# Patient Record
Sex: Female | Born: 1980 | Race: Black or African American | Hispanic: No | Marital: Single | Smoking: Former smoker
Health system: Southern US, Community
[De-identification: ages and names within clinical notes are randomized; demographics above are authoritative.]

## PROBLEM LIST (undated history)

## (undated) DIAGNOSIS — T8859XA Other complications of anesthesia, initial encounter: Secondary | ICD-10-CM

## (undated) DIAGNOSIS — I10 Essential (primary) hypertension: Secondary | ICD-10-CM

## (undated) DIAGNOSIS — T4145XA Adverse effect of unspecified anesthetic, initial encounter: Secondary | ICD-10-CM

## (undated) HISTORY — DX: Other complications of anesthesia, initial encounter: T88.59XA

## (undated) HISTORY — PX: PILONIDAL CYST EXCISION: SHX744

## (undated) HISTORY — PX: CHOLECYSTECTOMY: SHX55

## (undated) HISTORY — DX: Adverse effect of unspecified anesthetic, initial encounter: T41.45XA

---

## 2001-01-17 ENCOUNTER — Encounter: Payer: Self-pay | Admitting: Internal Medicine

## 2001-01-17 ENCOUNTER — Emergency Department (HOSPITAL_COMMUNITY): Admission: EM | Admit: 2001-01-17 | Discharge: 2001-01-17 | Payer: Self-pay | Admitting: Internal Medicine

## 2001-01-19 ENCOUNTER — Encounter: Payer: Self-pay | Admitting: Internal Medicine

## 2001-01-19 ENCOUNTER — Ambulatory Visit (HOSPITAL_COMMUNITY): Admission: RE | Admit: 2001-01-19 | Discharge: 2001-01-19 | Payer: Self-pay | Admitting: Internal Medicine

## 2004-04-20 ENCOUNTER — Inpatient Hospital Stay (HOSPITAL_COMMUNITY): Admission: RE | Admit: 2004-04-20 | Discharge: 2004-04-26 | Payer: Self-pay | Admitting: General Surgery

## 2006-01-24 IMAGING — US US OB COMP LESS 14 WK
1 series · 14 of 17 positions shown · non-contrast
Comparison: none

CLINICAL DATA: Determine fetal size.
 OB ULTRASOUND LESS THAN 14 WEEKS:
 Examination limited to transabdominal imaging as patient has a pilonidal cyst and is unable to position for adequate transvaginal imaging.  
 Single live intrauterine gestation identified.  Crown rump length measures 3.3 cm corresponding to gestational age of 10 weeks 2 days.  Morphologic survey not performed due to early gestational age.  Amniotic fluid volume subjectively normal.  No subchorionic hemorrhage.  No free fluid.

[Series 1: unknown · 0.32mm/px · 14 of 17 slices shown]
[im 1/17]
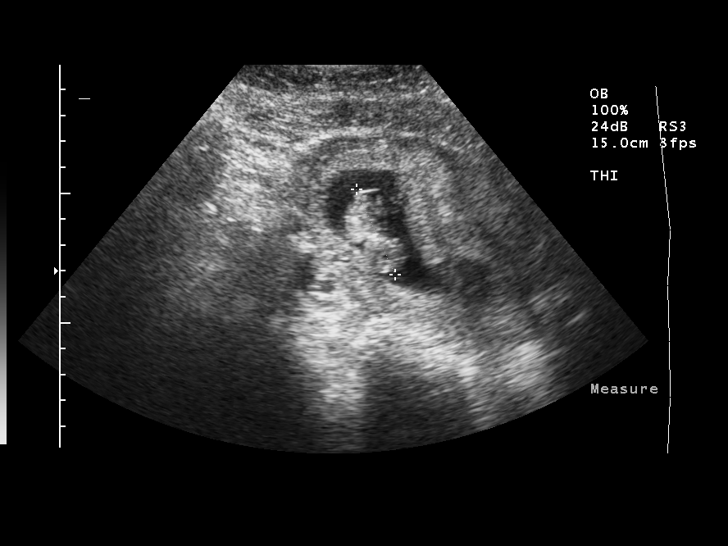
[im 2/17]
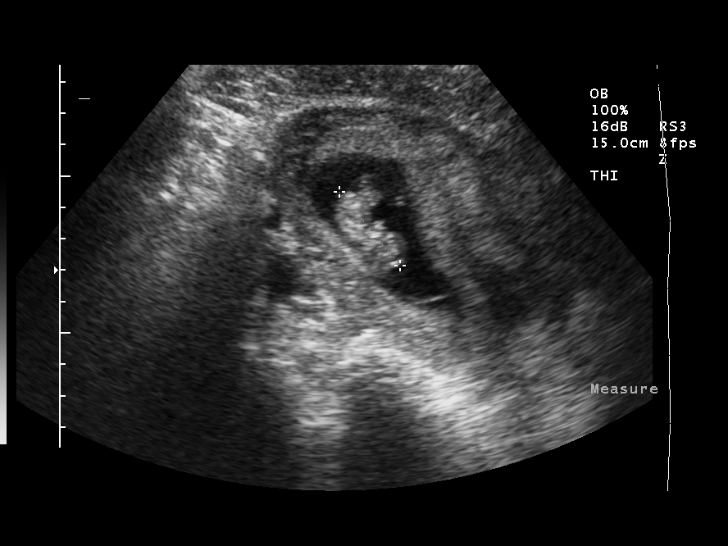
[im 4/17]
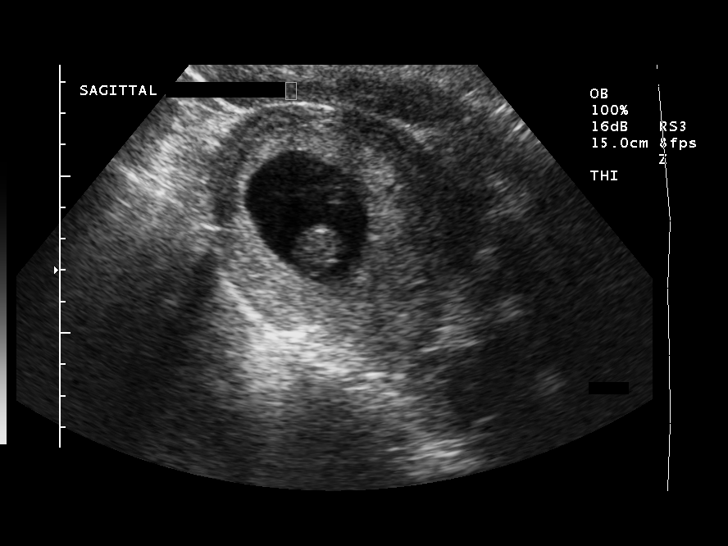
[im 5/17]
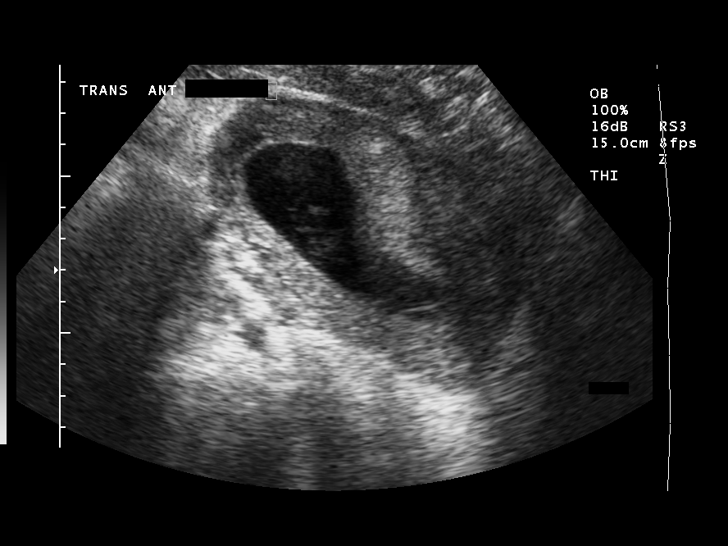
[im 6/17]
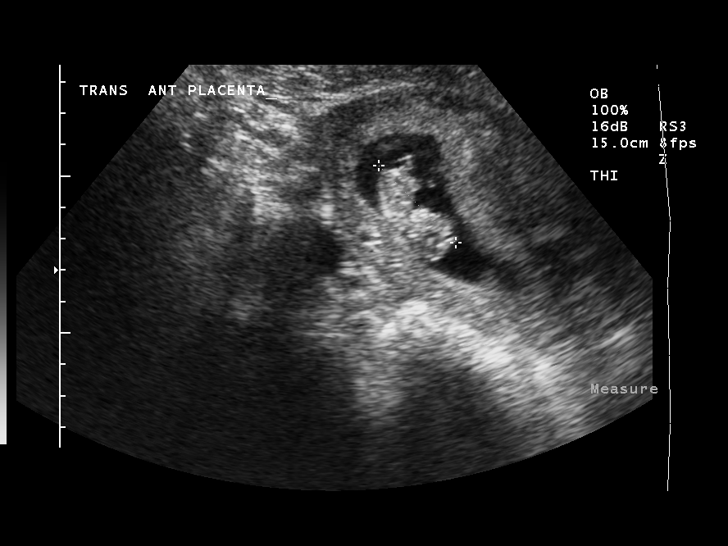
[im 7/17]
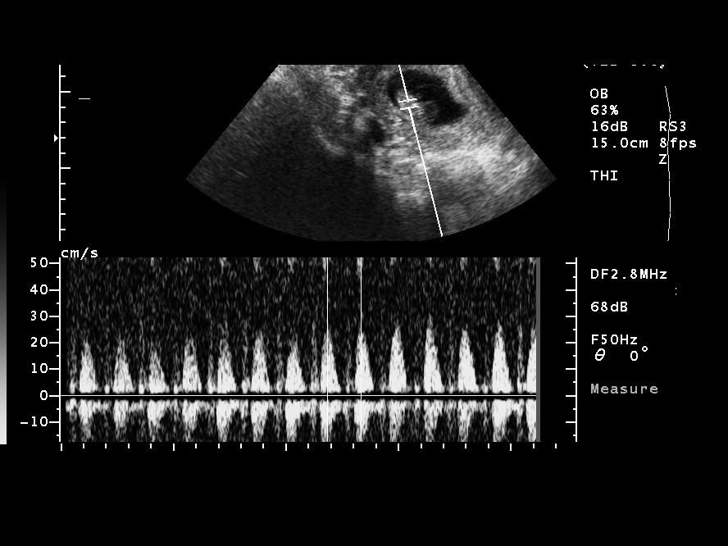
[im 8/17]
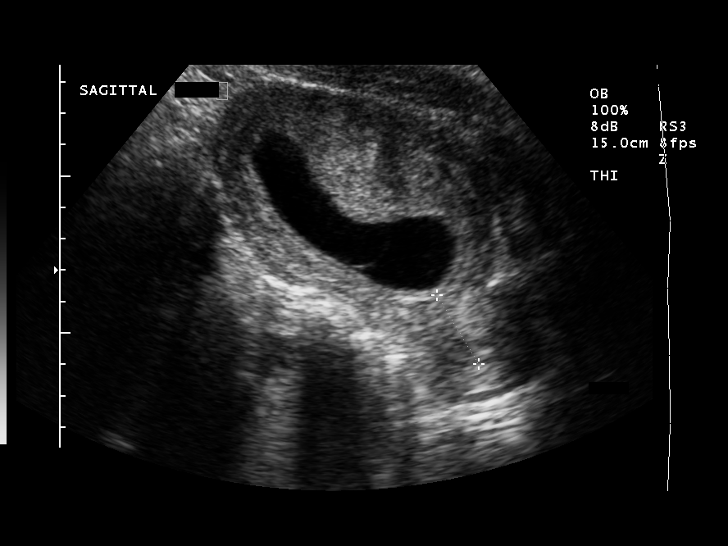
[im 10/17]
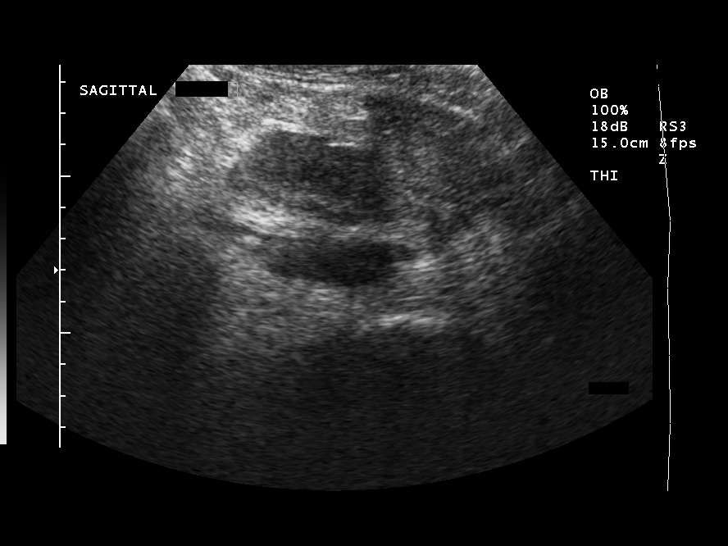
[im 11/17]
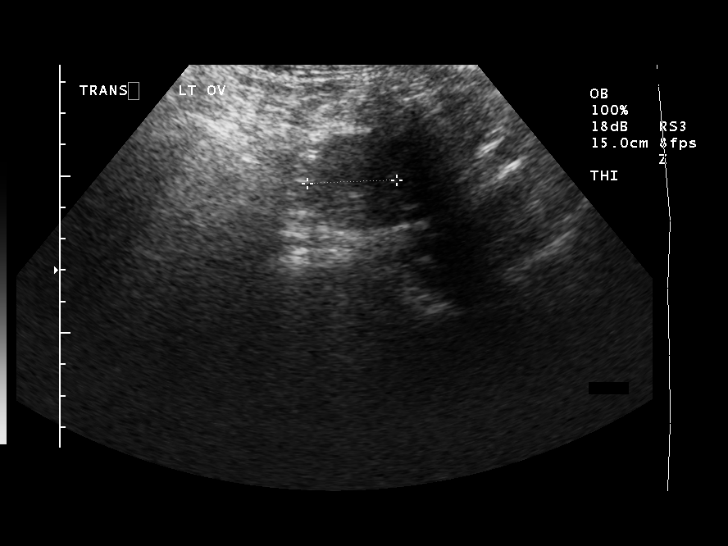
[im 12/17]
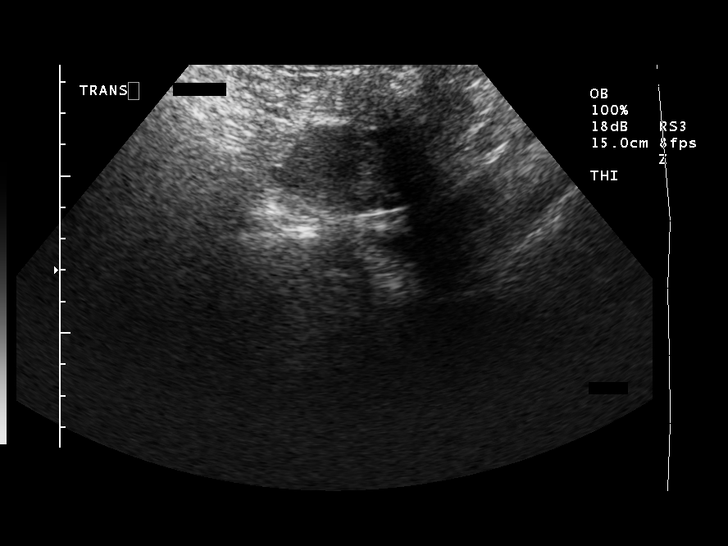
[im 13/17]
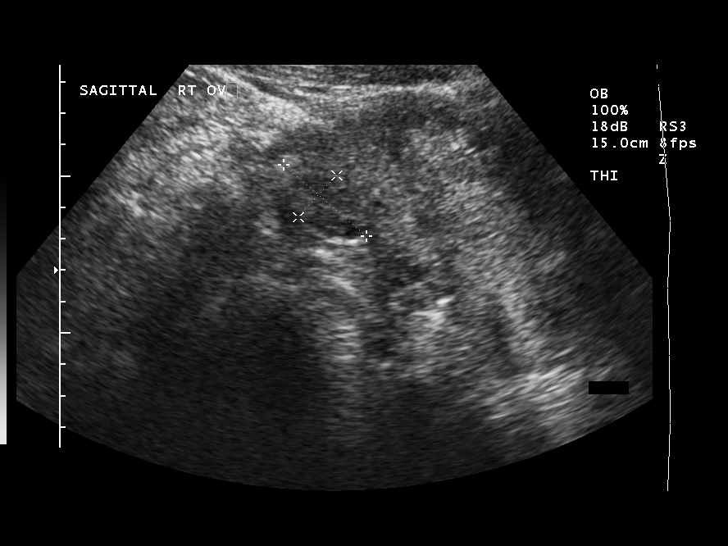
[im 14/17]
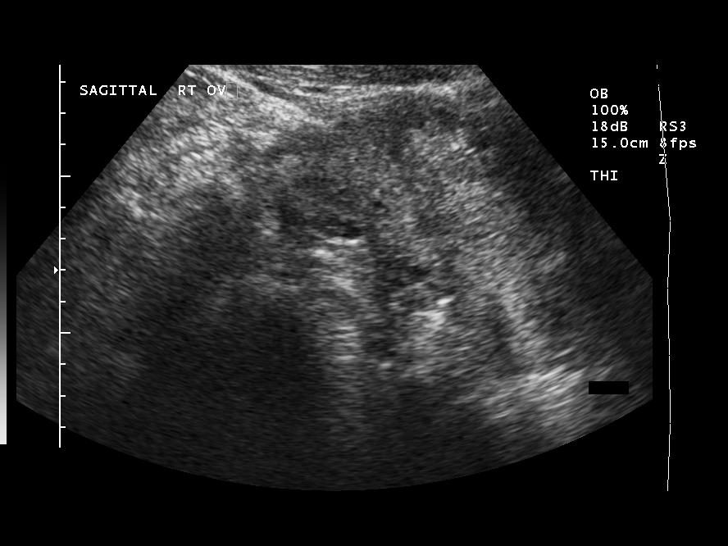
[im 16/17]
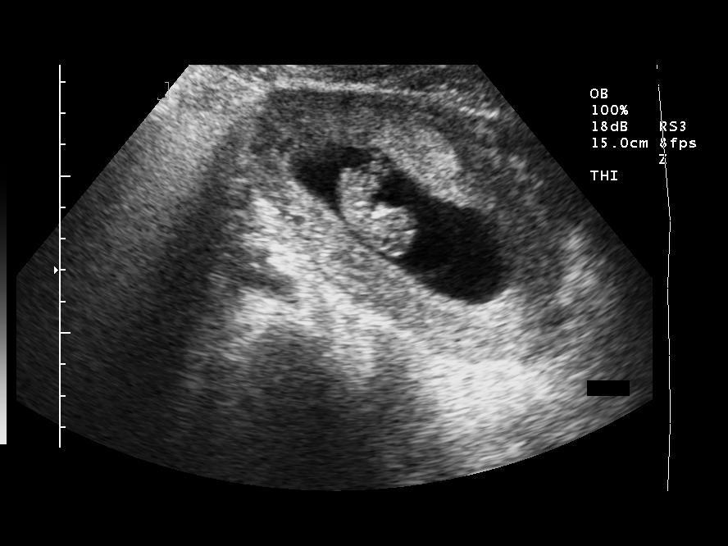
[im 17/17]
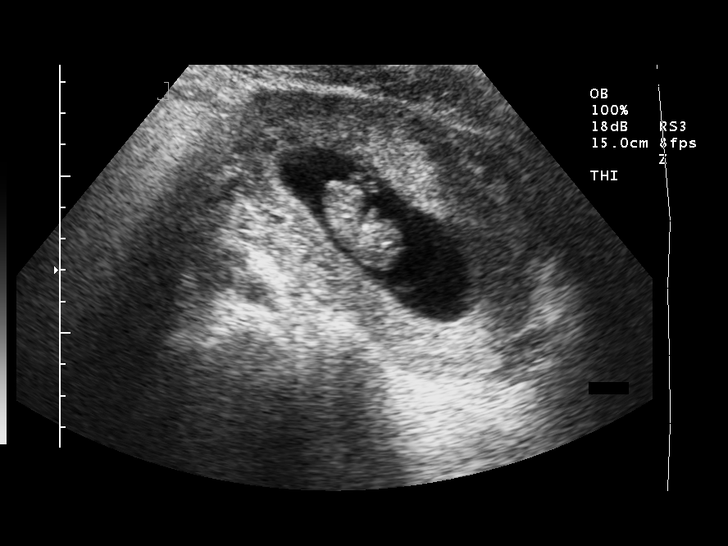

[14 of 17 positions shown; findings below may reference images not displayed]

IMPRESSION: Single live intrauterine gestation measured at 10 weeks 2 days estimated gestational age.  EDC correlates to 11/15/04.  Morphologic survey not performed due to early gestational age.

## 2006-01-26 IMAGING — CR DG CHEST 2V
2 series · 2 of 2 positions shown · non-contrast
Comparison: 04/22/04.

CLINICAL DATA: Pilonidal abscess.  Congestive heart failure.  
 PA AND LATERAL CHEST:

[view not recorded (1 of 2)]
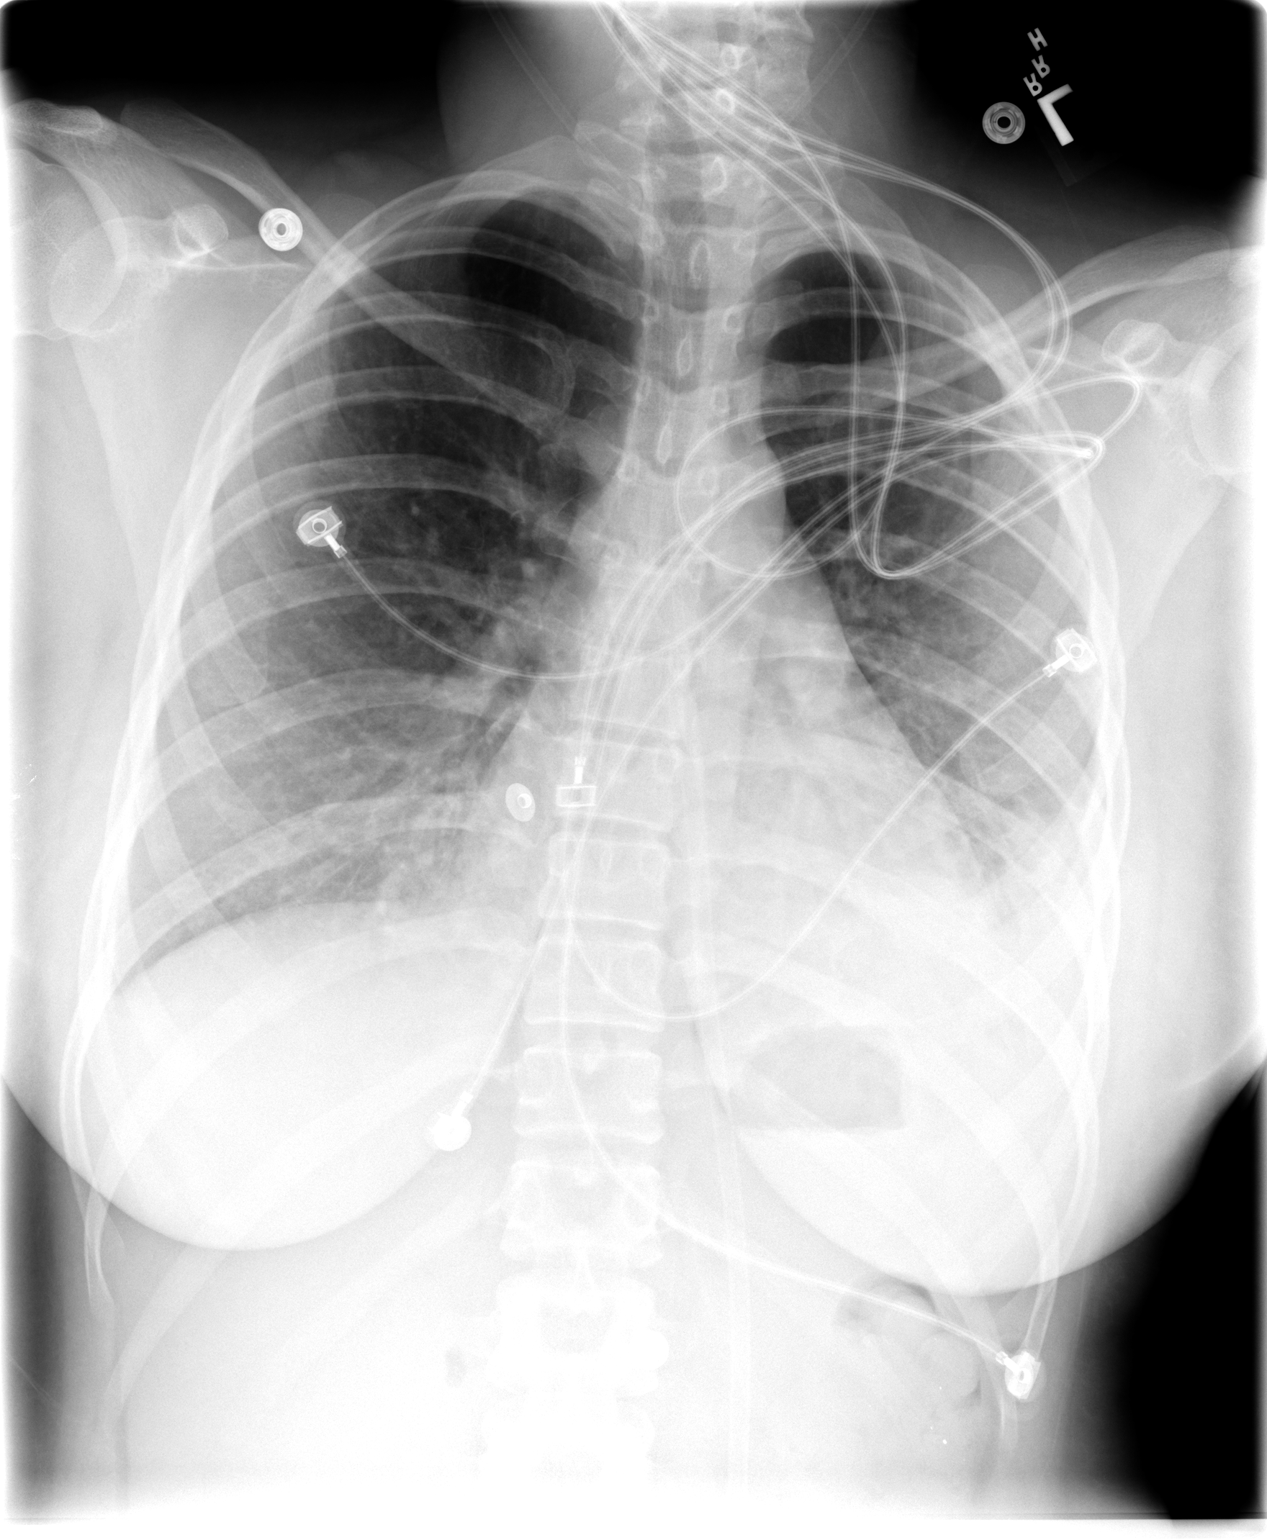

[view not recorded (2 of 2)]
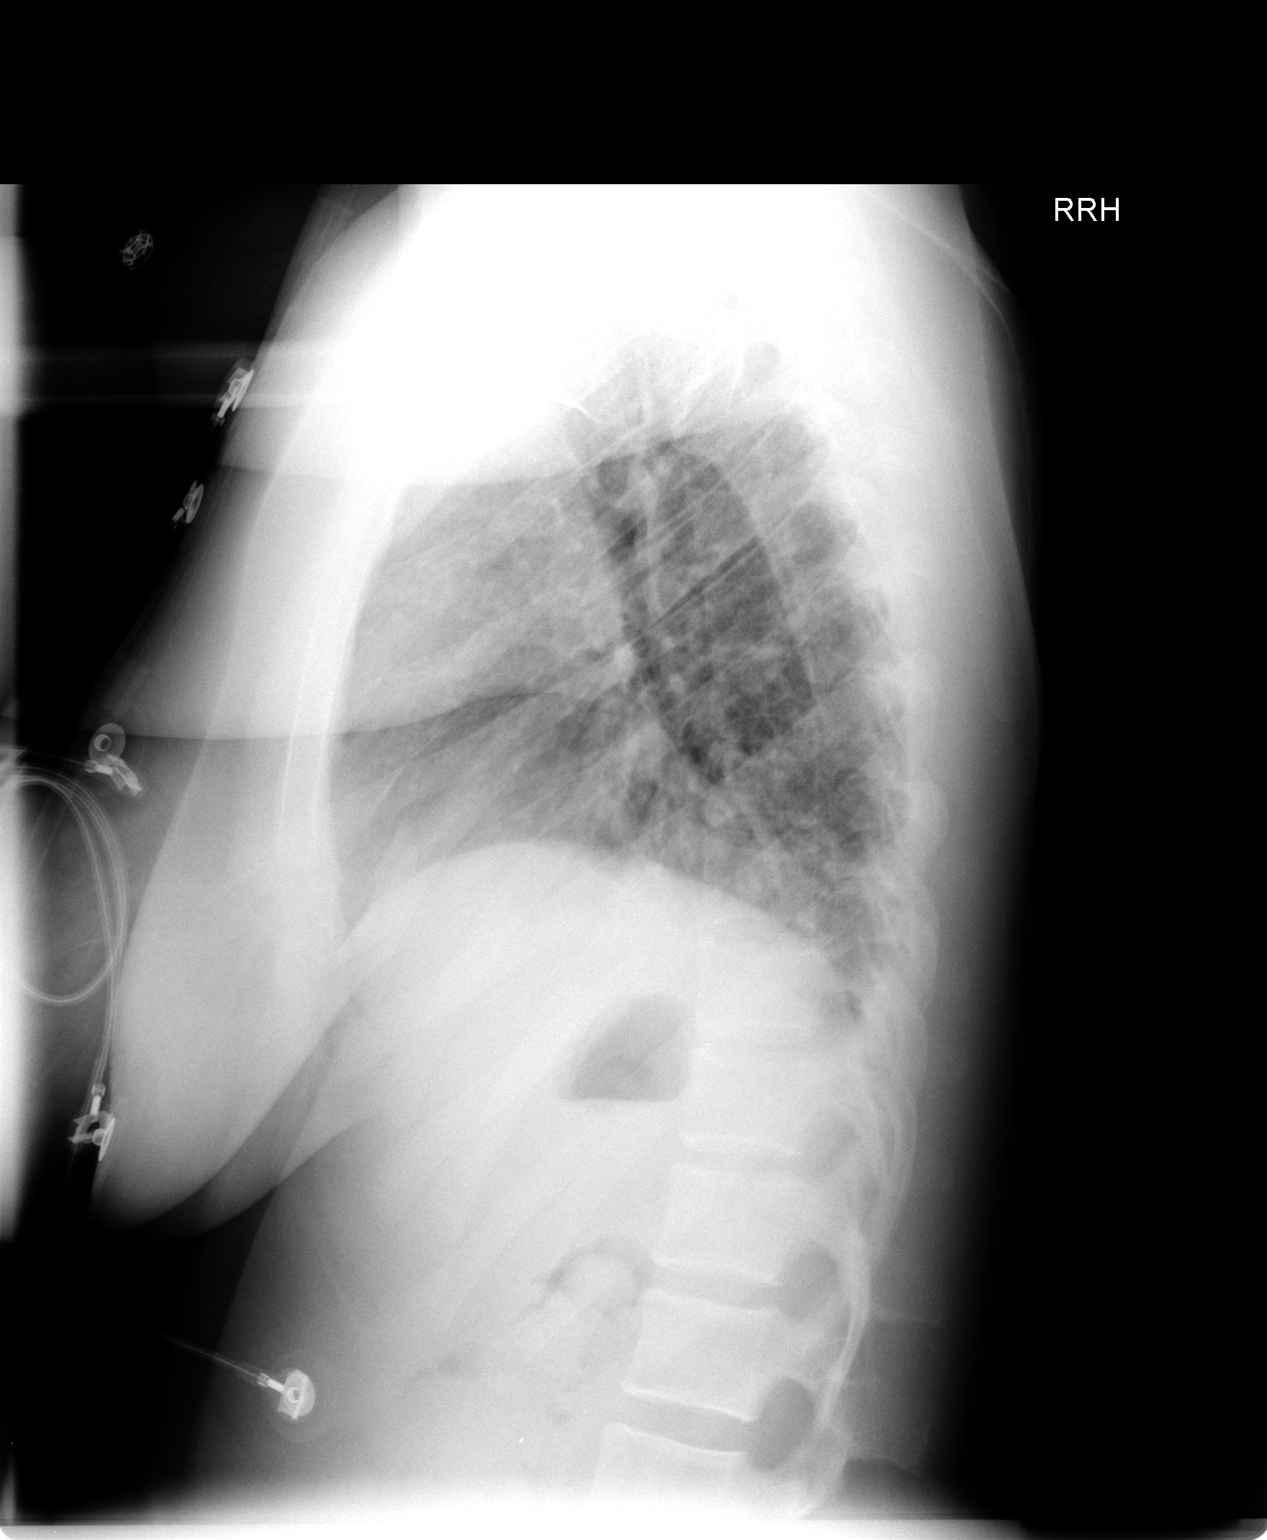

[2 of 2 positions shown; findings below may reference images not displayed]

The patient was double shielded for this examination.  There is persistent asymmetric airspace disease at the left lung base, suspicious for pneumonia.  There is probably a small amount of associated pleural fluid.  The pulmonary vasculature appears better defined with improving edema.  The cardiomediastinal contours are stable.
IMPRESSION: Improving pulmonary edema.  There is persistent asymmetric airspace disease of the left lung base suspicious for pneumonia.

## 2006-03-05 ENCOUNTER — Inpatient Hospital Stay (HOSPITAL_COMMUNITY): Admission: AD | Admit: 2006-03-05 | Discharge: 2006-03-06 | Payer: Self-pay | Admitting: Obstetrics and Gynecology

## 2006-03-26 ENCOUNTER — Inpatient Hospital Stay (HOSPITAL_COMMUNITY): Admission: AD | Admit: 2006-03-26 | Discharge: 2006-03-28 | Payer: Self-pay | Admitting: Obstetrics and Gynecology

## 2006-12-25 ENCOUNTER — Inpatient Hospital Stay (HOSPITAL_COMMUNITY): Admission: AD | Admit: 2006-12-25 | Discharge: 2006-12-26 | Payer: Self-pay | Admitting: Emergency Medicine

## 2006-12-25 ENCOUNTER — Ambulatory Visit: Payer: Self-pay | Admitting: Gastroenterology

## 2006-12-26 ENCOUNTER — Ambulatory Visit: Payer: Self-pay | Admitting: Gastroenterology

## 2006-12-29 ENCOUNTER — Ambulatory Visit (HOSPITAL_COMMUNITY): Admission: RE | Admit: 2006-12-29 | Discharge: 2006-12-29 | Payer: Self-pay | Admitting: Gastroenterology

## 2007-01-24 ENCOUNTER — Encounter (INDEPENDENT_AMBULATORY_CARE_PROVIDER_SITE_OTHER): Payer: Self-pay | Admitting: General Surgery

## 2007-01-24 ENCOUNTER — Ambulatory Visit (HOSPITAL_COMMUNITY): Admission: RE | Admit: 2007-01-24 | Discharge: 2007-01-24 | Payer: Self-pay | Admitting: General Surgery

## 2007-02-27 ENCOUNTER — Ambulatory Visit: Payer: Self-pay | Admitting: Gastroenterology

## 2007-07-18 ENCOUNTER — Ambulatory Visit: Payer: Self-pay | Admitting: Gastroenterology

## 2007-09-09 ENCOUNTER — Emergency Department (HOSPITAL_COMMUNITY): Admission: EM | Admit: 2007-09-09 | Discharge: 2007-09-09 | Payer: Self-pay | Admitting: Emergency Medicine

## 2008-01-08 DIAGNOSIS — R61 Generalized hyperhidrosis: Secondary | ICD-10-CM | POA: Insufficient documentation

## 2008-01-08 DIAGNOSIS — R42 Dizziness and giddiness: Secondary | ICD-10-CM

## 2008-01-08 DIAGNOSIS — K819 Cholecystitis, unspecified: Secondary | ICD-10-CM | POA: Insufficient documentation

## 2008-01-08 DIAGNOSIS — J811 Chronic pulmonary edema: Secondary | ICD-10-CM | POA: Insufficient documentation

## 2008-01-08 DIAGNOSIS — R1013 Epigastric pain: Secondary | ICD-10-CM

## 2008-01-08 DIAGNOSIS — K859 Acute pancreatitis without necrosis or infection, unspecified: Secondary | ICD-10-CM

## 2010-05-02 ENCOUNTER — Emergency Department (HOSPITAL_COMMUNITY)
Admission: EM | Admit: 2010-05-02 | Discharge: 2010-05-02 | Payer: Self-pay | Source: Home / Self Care | Admitting: Emergency Medicine

## 2010-08-10 LAB — URINE MICROSCOPIC-ADD ON

## 2010-08-10 LAB — URINALYSIS, ROUTINE W REFLEX MICROSCOPIC
Bilirubin Urine: NEGATIVE
Glucose, UA: NEGATIVE mg/dL
Ketones, ur: NEGATIVE mg/dL
Nitrite: POSITIVE — AB
Protein, ur: NEGATIVE mg/dL
Specific Gravity, Urine: 1.013 (ref 1.005–1.030)
Urobilinogen, UA: 1 mg/dL (ref 0.0–1.0)
pH: 5.5 (ref 5.0–8.0)

## 2010-08-10 LAB — URINE CULTURE
Colony Count: >=100000
Culture  Setup Time: 201112042346

## 2010-08-10 LAB — POCT PREGNANCY, URINE: Preg Test, Ur: NEGATIVE

## 2010-10-12 NOTE — Op Note (Signed)
NAME:  [name] [name], [name] [name]                 ACCOUNT NO.:  [id number]   MEDICAL RECORD NO.:  [id number]          PATIENT TYPE:  AMB   LOCATION:  DAY                           FACILITY:  [facility]   PHYSICIAN:  Dalia Heading, M.D.  DATE OF BIRTH:  08/20/1980   DATE OF PROCEDURE:  01/24/2007  DATE OF DISCHARGE:                               OPERATIVE REPORT   PREOPERATIVE DIAGNOSIS:  Cholecystitis, cholelithiasis, pancreatitis.   POSTOPERATIVE DIAGNOSIS:  Cholecystitis, cholelithiasis, pancreatitis.   PROCEDURE:  Laparoscopic cholecystectomy with intraoperative  cholangiogram.   SURGEON:  Dalia Heading, M.D.   ASSISTANT:  Dr. Franky Macho   ASSISTANT:   ANESTHESIA:  General endotracheal.   INDICATIONS:  The patient is a 30 year old black female who has a  history of gallstone pancreatitis and now presents for laparoscopic  cholecystectomy with cholangiogram.  The risks and benefits of the  procedure including bleeding, infection, hepatobiliary injury, and the  possibility of an open procedure were fully explained to the patient who  gave informed consent.   PROCEDURE NOTE:  The patient was placed in the supine position.  After  induction of general endotracheal anesthesia, the abdomen was prepped  and draped in the usual sterile technique with Betadine.  Surgical site  confirmation was performed.   An infraumbilical incision was made down to the fascia.  A Veress needle  was introduced into the abdominal cavity and confirmation of placement  was done using the saline drop test.  The abdomen was then insufflated  to 16 mmHg pressure.  An 11 mm trocar was introduced into the abdominal  cavity under direct visualization without difficulty.  The patient was  placed in reversed Trendelenburg position and an additional 12 mm trocar  was placed in the epigastric region, a 5 mm trocar was placed in the  right upper quadrant and right flank regions.  The liver was inspected  and noted to be  within normal limits.  The gallbladder was retracted  superior and laterally.  The dissection began around the infundibulum of  the gallbladder.  The cystic duct was first identified.  Its juncture to  the infundibulum was fully identified.  A single Endoclip was placed  proximally on the cystic duct.  An incision was made into the cystic  duct and a cholangiocatheter was inserted.  Under digital fluoroscopy,  cholangiograms were performed.  No filling defect was noted.  The dye  flowed freely into the duodenum.  The common bile duct was noted to be  generous, but again, no choledocholithiasis was seen.  The system was  then flushed with normal saline and the cholangiocatheter removed.   Multiple Endoclips were placed distally on the cystic duct and the  cystic duct was divided.  This was, likewise, done on the cystic artery.  The gallbladder was then freed away from the gallbladder fossa using  Bovie electrocautery.  The gallbladder was delivered through the  epigastric trocar site using an EndoCatch bag.  The gallbladder fossa  was inspected.  No abnormal bleeding or bile leakage was noted.  Surgicel was placed in the  gallbladder fossa.  All fluid and air were  then evacuated from the abdominal cavity prior to removal of the  trocars.   All wounds were irrigated with normal saline.  All wounds were injected  with 0.5% Sensorcaine.  The infraumbilical fascia was reapproximated  using an 0 Vicryl interrupted suture.  All skin incisions were closed  using staples.  Betadine ointment and dry sterile dressings were  applied.  All tape and needle counts were correct at the end of the  procedure.  The patient was extubated in the operating room and went  back to the recovery room awake in stable condition.  Complications were  none.  Specimen gallbladder.  Blood loss minimal.      Dalia Heading, M.D.  Electronically Signed     MAJ/MEDQ  D:  01/24/2007  T:  01/24/2007  Job:   [id number]   cc:   Kassie Mends, M.D.  [address]  [place] , [place] [address]   Tesfaye D. Felecia Shelling, MD  Fax: [phone]

## 2010-10-12 NOTE — Consult Note (Signed)
NAME:  COBBSYulieth, [name]                 ACCOUNT NO.:  [id number]   MEDICAL RECORD NO.:  [id number]          PATIENT TYPE:  INP   LOCATION:  A332                          FACILITY:  [facility]   PHYSICIAN:  Kassie Mends, M.D.      DATE OF BIRTH:  11/16/1980   DATE OF CONSULTATION:  12/25/2006  DATE OF DISCHARGE:                                 CONSULTATION   PRIMARY CARE PHYSICIAN:  Tesfaye D. Felecia Shelling, MD   REASON FOR CONSULTATION:  Epigastric pain and acute pancreatitis.   HISTORY OF PRESENT ILLNESS:  [name] [name] is a 30 year old female who  reported 2-year history of intermittent epigastric abdominal pain. She  describes acute onset of severe epigastric pain usually postprandially.  She was awakened about 3 a.m. this morning with severe pain. She rates  the pain 8/10 on pain scale, describes the pain as squeezing pressure  that radiates to both shoulders and through to her back. She denies any  nausea or vomiting with the pain.  She did have diaphoresis and some  dizziness.  She has tried Tagamet, Prilosec, and Prevacid recently.  She  felt initially began to help, but she continues to have episodes at  least 2-3 times a week.  Pain usually last an hour at worst with lying  supine.  She denies any history of GERD, denies any history of  dysphagia, odynophagia, anorexia, early satiety.  Her bowel movements  have been normal every day.  She denies rectal bleeding or melena.  Her  weight is still increasing.  She denies fever or chills, denies history  of alcohol use or hyperlipidemia.   She had elevated amylase of 396, lipase 704.  CT of abdomen and pelvis  with contrast done today appears negative.  She is suspected to have  pancreatitis.   PAST MEDICAL AND SURGICAL HISTORY:  Status post surgery, she developed  pulmonary edema shortly after surgery.  She has a history of heart  murmur.   ALLERGIES:  No known drug allergies.   FAMILY HISTORY:  Noncontributory.   SOCIAL HISTORY:  She is  single.  She lives with her family.  She has one  healthy son.  She denies tobacco or drug use.  She rarely consumes  alcohol.  Her last drink was over the weekend.  She tells me she does  not drink on a regular basis.   REVIEW OF SYSTEMS:  GYN:  Last menstrual period was July 20.  She tells  me she has regular cycles and does not have problems.   PHYSICAL EXAMINATION:  VITAL SIGNS: Weight 99.6 kg.  Height is 67  inches.  Temperature 97.9, pulse 67, respirations 20, blood pressure  126/68.  O2 saturation 99% on room air.  GENERAL:  [name] [name] is a female who is alert and cooperative, no acute  distress.  HEENT:  Normocephalic and atraumatic.  Sclerae clear, not icteric.  Posterior oropharynx pink and moist without any lesions.  NECK:  Supple without mass or thyromegaly.  HEART:  Regular rate and rhythm.  Normal S1 and S2.  She has a 1/6  murmur.  LUNGS: Clear to auscultation bilaterally.  ABDOMEN:  Protuberant with positive bowel sounds x4.  No bruits  auscultated, nondistended. She has mild tenderness in the epigastrium on  deep palpation. There is no rebound tenderness or guarding, no  hepatosplenomegaly or mass.  EXTREMITIES:  Without clubbing or edema bilaterally.  SKIN:  Warm and dry without rash or jaundice.   LABORATORY DATA:  WBC 6.4, hemoglobin 12.4, hematocrit 38.2, platelets  258.  Glucose 113, calcium 8.9, sodium 137, potassium 4, chloride 104,  CO2 30, BUN 9, creatinine 0.71, total bilirubin 0.4, alkaline  phosphatase 67, AST 18, ALT 14, total protein 6.9, albumin 3.6, amylase  396, lipase 104.   IMPRESSION:  [name] [name]  is a 30 year old female admitted with  intermittent epigastric pain which radiates to her back and shoulders  and associated with elevated amylase and lipase. She denies any history  of significant alcohol intake.  She has a gallbladder in situ.  Differential includes acute pancreatitis secondary to distal common bile  duct stone, sludge, pancreas  divisum, pancreatic sphincter of Oddi  dysfunction, autoimmune pancreatitis, and hypertriglyceridemia.   PLAN:  1. She is to continue n.p.o. status except for ice chips.  2. Will increase IV fluids to D5 normal saline at 200 mL an hour.  3. Check a urine HCG.  4. Agree with supportive measures including pain control and      antiemetics.  5. She will schedule EUS or MRCP to evaluate pancreas.  6. LFTs and lipase in the morning as well as check for triglycerides.   We would like to thank Dr. Felecia Shelling for allowing [place] to participate in the  care of Ms. Ebbert.      Lorenza Burton, N.P.      Kassie Mends, M.D.  Electronically Signed    KJ/MEDQ  D:  12/25/2006  T:  12/25/2006  Job:  [id number]   cc:   Rachael Fee, MD  [address]  [place], [place] [address]

## 2010-10-12 NOTE — H&P (Signed)
NAME:  Pacini, Vianny                 ACCOUNT NO.:  [id number]   MEDICAL RECORD NO.:  [id number]          PATIENT TYPE:  INP   LOCATION:  A332                          FACILITY:  [facility]   PHYSICIAN:  Tesfaye D. Felecia Shelling, MD   DATE OF BIRTH:  1980-10-16   DATE OF ADMISSION:  12/25/2006  DATE OF DISCHARGE:  LH                              HISTORY & PHYSICAL   CHIEF COMPLAINT:  Abdominal pain.   HISTORY OF PRESENT ILLNESS:  This is a 30 year old female patient with  no significant past medical history, who came to the emergency room with  the above complaint.  The patient has a history of intermittent  abdominal pain for the last several weeks.  She has been taking proton  pump inhibitors for epigastric pain.  The patient developed worsening of  right upper quadrant pain since December 22, 2006.  She continued to have  more epigastric and left upper quadrant pain, which was radiating to her  back.  She try to take pain medications.  However, her symptoms got  worse and she came to emergency room, where she was evaluated.  Her labs  showed elevated amylase and lipase.  However, her CT scan of the abdomen  was negative for any acute lesion.  The patient was admitted as a case  of acute pancreatitis.   REVIEW OF SYSTEMS:  The patient had symptoms of nausea and abdominal  discomfort.  No fever, chills, headache, chest pain, dysuria, urgency or  frequency of urination.   PAST MEDICAL HISTORY:  1. Intermittent abdominal pain which was being treated as possible      gastritis.  2. History of pilonidal abscess.   CURRENT MEDICATIONS:  Prevacid 30 mg p.o. daily.   SOCIAL HISTORY:  The patient is single.  No history of alcohol, tobacco  or substance abuse.   PHYSICAL EXAMINATION:  GENERAL:  The patient is alert, awake and sick-  looking.  VITALS:  Blood pressure 128/68, pulse 67, respiratory rate 20,  temperature 97.9 degrees Fahrenheit.  HEENT:  Pupils are equal and reactive.  NECK:  Supple.  CHEST:  Decreased air entry and a few rhonchi.  CARDIOVASCULAR:  First and second heart sounds heard.  No murmur, no  gallop.  ABDOMEN:  Soft and lax.  Bowel sounds are positive.  No mass, no  organomegaly.  There is moderate tenderness on the epigastric and left  upper quadrant area.  EXTREMITIES:  No leg edema.   LABORATORY DATA ON ADMISSION:  Lipase 740, amylase 396.  Sodium 137,  potassium 4, chloride 104, carbon dioxide 30, glucose 113, BUN 9,  creatinine 0.9 and bilirubin 0.4, alkaline phosphatase 67, AST 18, ALT  14, calcium 8.9.  CBC:  WBC 6.9, hemoglobin 12.3, hematocrit 38.2 and  platelet 258,000.   ASSESSMENT:  Acute pancreatitis, etiology not clear at this time.   PLAN:  We will keep the patient n.p.o.  We will continue IV fluids.  We  will continue p.r.n. pain medications.  We will do GI consult.  I will  continue Protonix.      Tesfaye  Ludwig Clarks, MD  Electronically Signed     TDF/MEDQ  D:  12/25/2006  T:  12/26/2006  Job:  [id number]

## 2010-10-12 NOTE — Assessment & Plan Note (Signed)
NAME:  [name] [name], [name] [name]                  CHART#:  [id number]   DATE:  02/27/2007                       DOB:  08/04/1980   PROBLEM LIST:  1. Gallstone pancreatitis resulting in subsequent laparoscopic      cholecystectomy.  Pathology showed gallstones and cholecystitis.  2. History of pilonidal cyst excision.   SUBJECTIVE:  [name] is a 30 year old female who presents in followup  after a laparoscopic cholecystectomy.  She denies any diarrhea  currently.  She did initially have loose stools after her gallbladder  was removed.  She does have loose stools when she eats a fatty meal.   OBJECTIVE:  VITAL SIGNS:  Weight 226-1/2 pounds.  Height 5 feet, 7  inches.  Temperature 98.8.  Blood pressure 118/88. Pulse 80.  NEUROLOGICAL:  She is in no apparent distress.  LUNGS:  Clear to auscultation bilaterally.  CARDIOVASCULAR:  Exam has a regular rhythm with no murmurs.  Normal S1  and S2.  ABDOMEN:  Bowel sounds are present, soft, nontender, nondistended.  No  rebound or guarding.   ASSESSMENT:  [name] is a 30 year old female who had gallstone  pancreatitis and is status post laparoscopic cholecystectomy and is  doing well.   Thank you for allowing me to see Ms. Poulsen in consultation.  My  recommendations follow.   RECOMMENDATIONS:  She may follow up with me as needed.       Kassie Mends, M.D.  Electronically Signed     SM/MEDQ  D:  02/27/2007  T:  02/28/2007  Job:  [id number]   cc:   Tesfaye D. Felecia Shelling, MD

## 2010-10-12 NOTE — H&P (Signed)
NAME:  [name], [name]                 ACCOUNT NO.:  [id number]   MEDICAL RECORD NO.:  [id number]          PATIENT TYPE:  AMB   LOCATION:  DAY                           FACILITY:  [facility]   PHYSICIAN:  Dalia Heading, M.D.  DATE OF BIRTH:  1980-12-11   DATE OF ADMISSION:  DATE OF DISCHARGE:  LH                              HISTORY & PHYSICAL   CHIEF COMPLAINT:  Cholecystitis, cholelithiasis, history of gallstone  pancreatitis.   HISTORY OF PRESENT ILLNESS:  The patient is a 30 year old black female  who was referred for evaluation and treatment of biliary colic secondary  to cholelithiasis. She has been having right upper quadrant abdominal  pain, nausea and bloating for many weeks. She had a recent admission for  gallstone pancreatitis. She does have fatty food intolerance. No fever,  chills, jaundice had been noted.   PAST MEDICAL HISTORY:  Unremarkable.   PAST SURGICAL HISTORY:  Pilonidal cyst excision.   CURRENT MEDICATIONS:  Prevacid.   ALLERGIES:  NO KNOWN DRUG ALLERGIES.   REVIEW OF SYSTEMS:  Noncontributory.   PHYSICAL EXAMINATION:  GENERAL APPEARANCE: On physical examination the  patient is a well-developed and well-nourished black female in no acute  distress.  HEENT: Examination reveals no scleral icterus.  LUNGS: The lungs are clear to auscultation with equal breath sounds  bilaterally.  HEART: Examination reveals a regular rate and rhythm without S3, S4,  murmurs.  ABDOMEN: The abdomen is soft and nondistended. She is tender in the  right upper quadrant to palpation. No hepatosplenomegaly, masses or  hernias identified.   LABORATORY DATA:  Ultrasound of the gallbladder reveals cholelithiasis  with a normal common bile duct.   IMPRESSION:  Cholecystitis, cholelithiasis.   PLAN:  The patient is scheduled for a laparoscopic cholecystectomy with  cholangiograms on 01/24/07. The risks and benefits of the procedure,  including bleeding, infection, hepatobiliary  injury, the possibility of  an open procedure were fully explained to the patient, gave informed  consent.      Dalia Heading, M.D.  Electronically Signed     MAJ/MEDQ  D:  01/18/2007  T:  01/19/2007  Job:  [id number]   cc:   Kassie Mends, M.D.  [address]  [place] , [place] [address]   Tesfaye D. Felecia Shelling, MD  Fax: [phone]

## 2010-10-12 NOTE — Assessment & Plan Note (Signed)
NAMESUZETTE, [name] [name]                   CHART#:  [id number]   DATE:  07/18/2007                       DOB:  [birth year]   REFERRING PHYSICIAN:  Avon Gully, MD.   PROBLEM LIST:  1. Gallstone pancreatitis.  2. Laparoscopic cholecystectomy with intraoperative cholangiogram in      August, 2008.  3. Cholecystitis and cholelithiasis.  4. Pilonidal cyst excision.   SUBJECTIVE:  Ms. [name] [name] is 30 year old female who presents with loose  stool after eating.  Sometimes the loose stool goes away and she may  have normal stools.  She may have to run to have a bowel movement  immediately or it may take up to 20 minutes.  She tried reducing fried  foods which did not really change her symptoms significantly.  She has  not tried any medicines.  She can have watery diarrhea.  Prior to her  surgery she used to have hard stool and occasionally have loose stool if  she was ill.  Her last menstrual period was July 05, 2007.   MEDICATIONS:  None.   OBJECTIVE:  PHYSICAL EXAM:  Weight 223 pounds (up 4 pounds since August  of 2008), height 5 feet 7 inches, BMI 34.9 (obese).  Temperature 98.6,  blood pressure 122/84, pulse 80.  GENERAL:  She is in no apparent distress, alert and oriented x4.LUNGS:  Clear to auscultation bilaterally.CARDIOVASCULAR EXAM:  Regular rhythm,  no murmur, normal S1 and S2.ABDOMEN:  Bowel sounds are present, soft,  nontender, nondistended, no rebound or guarding.   ASSESSMENT:  Ms. 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   Thank you for allowing me to see Ms. [name] [name] in consultation.  My  recommendations follow.   RECOMMENDATIONS:  1. Add Colestid one tablet in the morning for 1 week.  If her stools      continue to be loose after eating then she is to increase that to      two in the morning.  2. She is to follow a low-fat diet.  She is given a handout on a low-      fat diet.  3. She has a followup appointment to see me in 6  weeks.       Kassie Mends, M.D.  Electronically Signed     SM/MEDQ  D:  07/18/2007  T:  07/19/2007  Job:  [id number]   cc:   Dalia Heading, M.D.  Tesfaye D. Felecia Shelling, MD

## 2010-10-12 NOTE — Discharge Summary (Signed)
NAME:  [name] [name], [name] [name]                 ACCOUNT NO.:  [id number]   MEDICAL RECORD NO.:  [id number]          PATIENT TYPE:  INP   LOCATION:  A332                          FACILITY:  [facility]   PHYSICIAN:  Kassie Mends, M.D.      DATE OF BIRTH:  [birth year]   DATE OF ADMISSION:  12/25/2006  DATE OF DISCHARGE:  07/29/2008LH                               DISCHARGE SUMMARY   PROBLEMS:  1. Acute idiopathic pancreatitis.  2. History of pilonidal abscess, requiring resection.   DISCHARGE MEDICATIONS:  1. Vicodin 5/500 mg, one to two q.6h. p.r.n. pain, #15.  2. Phenergan 25 mg tab, one to two q.4h. p.r.n. nausea or vomiting,      #20.   DIET:  A low-fat diet.   HISTORY OF PRESENT ILLNESS:  [name] [name] is a 30 year old female who  presents with a two-year history of intermittent epigastric abdominal  pain.  The abdominal pain usually occurs after eating.  On the day of  admission she was awakened with severe abdominal pain and came to the  emergency room.   HOSPITAL COURSE:  [name] [name] was admitted with acute idiopathic  pancreatitis.  Her lipase was 704 and her amylase was 396.  Her liver  enzymes were a total bilirubin of 0.4, alkaline phosphatase 67, AST 18,  ALT 14, total protein 6.9, albumin 3.6.  She had a CT scan of the  abdomen and pelvis with contrast which showed no inflammatory process or  abnormal fluid collection involving the pancreas.  She was admitted with  abdominal pain and nausea.  Overnight she required no Dilaudid or  Zofran.  An abdominal ultrasound was performed on hospital day two,  which revealed small gallstones and normal common bile duct and no  evidence of acute cholecystitis.   On the day of discharge she is tolerating a low-fat diet without any  abdominal pain or nausea.   DISCHARGE LABORATORY DATA:  White count 6.4, hemoglobin 12.3, platelets  258.  Total bilirubin 0.4, direct bilirubin 0.1, alkaline phosphatase  57, AST 15, ALT 11, albumin 3, amylase 91, lipase  22.      Kassie Mends, M.D.  Electronically Signed     SM/MEDQ  D:  12/26/2006  T:  12/26/2006  Job:  [id number]   cc:   Tesfaye D. Felecia Shelling, MD  Fax: [phone]   Rachael Fee, MD  [address]  [place], [place] [address]

## 2010-10-15 NOTE — Procedures (Signed)
NAME:  [name] [name], [name] [name]              ACCOUNT NO.:  [id number]   MEDICAL RECORD NO.:  [id number]          PATIENT TYPE:  INP   LOCATION:  A206                          FACILITY:  [facility]   PHYSICIAN:  Richard A. Alanda Amass, M.D.DATE OF BIRTH:  [birth year]   DATE OF PROCEDURE:  04/23/2004  DATE OF DISCHARGE:                                  ECHOCARDIOGRAM   Echocardiogram was technically adequate.   The aorta is normal at 2.3 cm.   The aortic valve has three leaflets which appear normal.  There is no AS or  AI, and there is normal valve opening and pliable thin leaflets.   The left atrium is normal at 3.8 cm.  There are not clots seen.  The patient  was in sinus rhythm during the study.   IVS and LVPW are not outside the limits of normal measuring 1.3 and 1.0 cm,  respectively.  There was normal motion throughout.   Mitral valve is normal with thin, pliable leaflets.  There was trace mitral  regurgitation and no mitral valve prolapse.   Left ventricular internal dimensions were _________.  LV IDD equals 4.9 cm.  LV ISD equals 2.6 cm.  There was normal systolic thickening and wall motion  throughout with no segmental wall motion abnormalities and normal EF  estimated greater than 55%.  Normal diastolic parameters.   There was no pericardiac effusion.   The aortic root is normal.   Right ventricle was normal.   There is trace to mild tricuspid regurgitation present.   The 2-D echocardiogram was essentially within normal limits.  There were  normal chamber dimensions and no evidence of systolic or diastolic  dysfunction with estimated EF greater than 55% and no significant valvular  abnormality.  There was mild pericardial posterior wall systolic separation  which is not outside the limits of normal.      RAW/MEDQ  D:  04/23/2004  T:  04/23/2004  Job:  [id number]   cc:   Dirk Dress. Katrinka Blazing, M.D.  [address]  [place]  [place] [address]  Fax: [phone]   Dani Gobble, MD  Fax:  [phone]

## 2010-10-15 NOTE — Discharge Summary (Signed)
NAME:  [name] [name], [name] [name]                ACCOUNT NO.:  [id number]   MEDICAL RECORD NO.:  [id number]          PATIENT TYPE:  INP   LOCATION:  A206                          FACILITY:  [facility]   PHYSICIAN:  Dirk Dress. Katrinka Blazing, M.D.   DATE OF BIRTH:  07/01/1980   DATE OF ADMISSION:  04/20/2004  DATE OF DISCHARGE:  11/28/2005LH                                 DISCHARGE SUMMARY   DISCHARGE DIAGNOSES:  1.  Pilonidal abscess  2.  Postoperative pulmonary edema, etiology undefined, resolved.  3.  Intrauterine pregnancy with gestational age by ultrasound of 10 weeks 2      days, as of April 21, 2004.   PROCEDURE:  Wide excision and drainage of major pilonidal abscess on  April 20, 2004.   DISPOSITION:  The patient is discharged home in stable and satisfactory  condition.   DISCHARGE MEDICATIONS:  1.  Keflex 500 mg q.i.d.  2.  Oxycodone 5/325 mg one tab q.6h. p.r.n. pain.   FOLLOWUP:  The patient is scheduled to be seen in the office one week post-  discharge, and will see OB/GYN one week post-discharge.   SUMMARY:  A 30 year old female with a four day history of a painful mass  over her intergluteal crease.  The pain became intense on the day of  evaluation, and she was seen in the office where she was noted to have a  pilonidal abscess.  She was admitted for IV antibiotics and excision of area  under anesthesia.   PHYSICAL EXAMINATION:  General physical examination was unremarkable except  for a tender mass of the intergluteal crease starting at the tip of the  coccyx and extending up to the lower sacral area.  There was no drainage,  but there was extensive erythema.   HOSPITAL COURSE:  The patient was admitted and underwent wide excision of  pilonidal abscess under spinal anesthesia on April 20, 2004.  She did  well in the early postoperative period, but on the early morning of April 22, 2004, she developed headache, shortness of breath, and was noted to have  an O2 saturation of  66%.  She had a blood pressure of 122/66, pulse 128,  respirations 48, temperature 101.1.  Dr. Loleta Chance was on call and he treated  her.  His initial impression was that she may of had a PE, but she had x-ray  evidence of congestive heart failure.  She was started on 100% non-  rebreathing mask and was given IV Lasix.  She improved with this therapy.  Her O2 saturation increased to 92% on 100% non-rebreathing.  The patient's  IV was decreased to 30 cc, and she was transferred to the ICU.  There was a  plan to get a CT scan, but because of the early gestational age of her  pregnancy, this was not done.  Since the patient had clinical evidence of  congestive heart failure, it was felt that she had acute pulmonary edema and  that this needed to be evaluated.  By April 18, 2004, she was see in  consultation by cardiology, Dr. Domingo Sep.  The actual etiology of her  pulmonary edema was never determined.  The patient responded to appropriate  treatment.  She became asymptomatic.  Her tachycardia and hypoxemia  resolved.  Lungs became clear.  By April 25, 2004, she was stable except  for mild dyspnea on exertion.  She had a few basilar rales.  Presacral area  was healing without difficulty.  Room air blood gases revealed a pH of 7.45,  PCO2 of 36, PO2 of 116.  V/Q scan was done and this was interpreted as  showing low probability of PE.  Room air saturation was between 96 and 98%  at that time.  Her wound looked good, and it was therefore decided that she  could be discharged home.  She was discharged home in stable and  satisfactory condition with plans for followup as an outpatient.     Lero   LCS/MEDQ  D:  05/16/2004  T:  05/17/2004  Job:  [id number]

## 2010-10-15 NOTE — H&P (Signed)
NAME:  [name] [name], [name] [name]                 ACCOUNT NO.:  [id number]   MEDICAL RECORD NO.:  [id number]          PATIENT TYPE:  INP   LOCATION:  9166                          FACILITY:  [facility]   PHYSICIAN:  Lenoard Aden, M.D.DATE OF BIRTH:  April 13, 1981   DATE OF ADMISSION:  03/26/2006  DATE OF DISCHARGE:                                HISTORY & PHYSICAL   CHIEF COMPLAINT:  Post dates for induction.   HISTORY OF PRESENT ILLNESS:  She is a 30 year old African-American female,  G1, P0 at 41-3/[redacted] weeks gestation for post dates induction.   PAST MEDICAL HISTORY:  Her past medical history is remarkable for:  1. Uncomplicated abortion.  2. History of pilonidal cyst.  3. 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  4. Respiratory reaction to anesthesia on removal of pilonidal cyst.   ALLERGIES:  She has no known drug allergies.   HABITS:  She is a nonsmoker, nondrinker and she denies domestic or physical  violence.   MEDICATIONS:  Prenatal vitamins.   FAMILY HISTORY:  Hodgkin's disease.   OBSTETRICAL HISTORY:  Prenatal course complicated by post dates status.   PHYSICAL EXAMINATION:  GENERAL APPEARANCE:  Well-developed, well-nourished  African-American female in no acute distress.  HEENT:  Normal.  LUNGS:  Clear.  HEART:  Regular rhythm.  ABDOMEN:  Soft, gravid and nontender.  Estimated fetal weight 8 to 8-1/2  pounds.  PELVIC:  Cervix is 1 cm dilated, 50%, vertex, minus 1.  EXTREMITIES:  Reveal no cords.  NEUROLOGICAL:  Exam is nonfocal   IMPRESSION:  1. Post dates intrauterine pregnancy.  2. Group B Strep positive.   PLAN:  Proceed with attempts at vaginal delivery, Pitocin, epidural as  needed.      Lenoard Aden, M.D.  Electronically Signed     RJT/MEDQ  D:  03/26/2006  T:  03/26/2006  Job:  [id number]

## 2010-10-15 NOTE — Op Note (Signed)
NAME:  [name], [name]                 ACCOUNT NO.:  [id number]   MEDICAL RECORD NO.:  [id number]          PATIENT TYPE:  AMB   LOCATION:  DAY                           FACILITY:  [facility]   PHYSICIAN:  Leroy C. Katrinka Blazing, M.D.   DATE OF BIRTH:  07-22-1980   DATE OF PROCEDURE:  04/20/2004  DATE OF DISCHARGE:                                 OPERATIVE REPORT   PREOPERATIVE DIAGNOSIS:  Pilonidal abscess.   POSTOPERATIVE DIAGNOSIS:  Major pilonidal abscess.   PROCEDURE:  Wide excision of major pilonidal abscess.   SURGEON:  Dirk Dress. Katrinka Blazing, M.D.   DESCRIPTION:  Under spinal anesthesia, the perianal and presacral area were  prepped and draped in a sterile field. Small incision was made in the  midline, and large volume pus was obtained. Aerobic and anaerobic cultures  were taken. The large abscess cavity was then unroofed, removing a segment  of tissue that measured about 4.5 cm. There was extensive inflammatory  necrosis of the tissue down into the presacral fascia. It extended down  towards the perirectal space but did not enter the perirectal space. Copious  irrigation was carried until all inflammatory debris was removed. Swabbing  of the wound was carried out, and the major portion of the inflammatory  tissue was also removed. No necrotic tissue was left. Hemostasis was  achieved. The wound was then packed with 1-inch Iodoform gauze using an  entire bottle. A sterile dressing was applied. The patient was transferred  to a bed and taken to the postoperative anesthetic area in satisfactory  condition.     Lero   LCS/MEDQ  D:  04/20/2004  T:  04/20/2004  Job:  [id number]

## 2010-10-15 NOTE — H&P (Signed)
NAME:  [name] [name], [name] [name]                 ACCOUNT NO.:  [id number]   MEDICAL RECORD NO.:  [id number]           PATIENT TYPE:   LOCATION:                                 FACILITY:   PHYSICIAN:  Jerolyn Shin C. Katrinka Blazing, M.D.        DATE OF BIRTH:   DATE OF ADMISSION:  DATE OF DISCHARGE:  [facility]                                HISTORY & PHYSICAL   HISTORY OF PRESENT ILLNESS:  A 30 year old female who presents with a four-  day history of painful swelling of her gluteal crease.  The pain became  intense on the day of evaluation and she was seen in the office where it was  noted that she had an pilonidal abscess.  She is scheduled for wide excision  of the area under anesthesia.   PAST MEDICAL HISTORY:  She has had no significant medical illness.   MEDICATIONS:  She takes no medication.   ALLERGIES:  She has no allergies.   PAST SURGICAL HISTORY:  She has not had any surgery.   SOCIAL HISTORY:  She does not smoke, drink or use drugs.  She is single and  employed.   PHYSICAL EXAMINATION:  VITAL SIGNS:  Blood pressure 110/70, pulse 72,  respirations 18, weight 190 pounds.  HEENT:  Unremarkable.  NECK:  Supple without JVD or bruits.  CHEST:  Clear to auscultation.  HEART:  Regular rate and rhythm without murmur, gallop or rub.  ABDOMEN:  Soft, mildly obese.  Nontender.  RECTAL:  Unremarkable.  There is a tender mass of the intergluteal crease  starting at about the tip of the coccyx and extending up to the lower sacral  area.  There is no drainage but an extensive area of erythema.  EXTREMITIES:  No cyanosis, clubbing or edema.  NEUROLOGIC:  No focal motor, sensory or cerebellar deficits.   IMPRESSION:  Pilonidal abscess.   PLAN:  Wide excision and drainage under anesthesia.     Lero   LCS/MEDQ  D:  04/19/2004  T:  04/20/2004  Job:  [id number]   cc:   [facility]  Fax: [phone]

## 2011-02-22 LAB — STREP A DNA PROBE: Group A Strep Probe: NEGATIVE

## 2011-03-07 ENCOUNTER — Emergency Department: Payer: Self-pay | Admitting: Emergency Medicine

## 2011-03-11 LAB — BASIC METABOLIC PANEL
BUN: 8
CO2: 30
Calcium: 8.6
Chloride: 106
Creatinine, Ser: 0.76
GFR calc Af Amer: >60
GFR calc non Af Amer: >60
Glucose, Bld: 97
Potassium: 3.8
Sodium: 138

## 2011-03-11 LAB — HEPATIC FUNCTION PANEL
ALT: 29
AST: 23
Albumin: 3.5
Alkaline Phosphatase: 67
Bilirubin, Direct: 0.1
Indirect Bilirubin: 0.3
Total Bilirubin: 0.4
Total Protein: 6.6

## 2011-03-11 LAB — CBC
HCT: 37.9
Hemoglobin: 12.1
MCHC: 32.1
MCV: 77.2 — ABNORMAL LOW
Platelets: 283
RBC: 4.91
RDW: 16.1 — ABNORMAL HIGH
WBC: 3.9 — ABNORMAL LOW

## 2011-03-11 LAB — LIPASE, BLOOD: Lipase: 17

## 2011-03-11 LAB — AMYLASE
Amylase: 232 — ABNORMAL HIGH
Amylase: 78

## 2011-03-11 LAB — HCG, QUANTITATIVE, PREGNANCY: hCG, Beta Chain, Quant, S: <2

## 2011-03-14 LAB — CBC
HCT: 38.2
Hemoglobin: 12.3
MCHC: 32.2
MCV: 77 — ABNORMAL LOW
Platelets: 258
RBC: 4.96
RDW: 14.8 — ABNORMAL HIGH
WBC: 6.4

## 2011-03-14 LAB — URINALYSIS, ROUTINE W REFLEX MICROSCOPIC
Bilirubin Urine: NEGATIVE
Glucose, UA: NEGATIVE
Hgb urine dipstick: NEGATIVE
Ketones, ur: NEGATIVE
Nitrite: NEGATIVE
Protein, ur: NEGATIVE
Specific Gravity, Urine: 1.015
Urobilinogen, UA: 0.2
pH: 6

## 2011-03-14 LAB — HEPATIC FUNCTION PANEL
Albumin: 3 — ABNORMAL LOW
Total Protein: 5.5 — ABNORMAL LOW

## 2011-03-14 LAB — DIFFERENTIAL
Basophils Absolute: 0
Basophils Relative: 1
Eosinophils Absolute: 0
Eosinophils Relative: 0
Lymphocytes Relative: 16
Lymphs Abs: 1
Monocytes Absolute: 0.2
Monocytes Relative: 4
Neutro Abs: 5.1
Neutrophils Relative %: 80 — ABNORMAL HIGH

## 2011-03-14 LAB — PREGNANCY, URINE: Preg Test, Ur: NEGATIVE

## 2011-03-14 LAB — COMPREHENSIVE METABOLIC PANEL
ALT: 14
AST: 18
Albumin: 3.6
Alkaline Phosphatase: 67
BUN: 9
CO2: 30
Calcium: 8.9
Chloride: 104
Creatinine, Ser: 0.71
GFR calc Af Amer: >60
GFR calc non Af Amer: >60
Glucose, Bld: 113 — ABNORMAL HIGH
Potassium: 4
Sodium: 137
Total Bilirubin: 0.4
Total Protein: 6.9

## 2011-03-14 LAB — LIPASE, BLOOD: Lipase: 22

## 2011-03-14 LAB — AMYLASE: Amylase: 396 — ABNORMAL HIGH

## 2011-03-14 LAB — TRIGLYCERIDES: Triglycerides: 65

## 2011-06-29 ENCOUNTER — Emergency Department (HOSPITAL_COMMUNITY)
Admission: EM | Admit: 2011-06-29 | Discharge: 2011-06-29 | Disposition: A | Discharge status: Home / Self Care | Payer: 59 | Source: Home / Self Care | Attending: Family Medicine | Admitting: Family Medicine

## 2011-06-29 ENCOUNTER — Encounter (HOSPITAL_COMMUNITY): Payer: Self-pay | Admitting: *Deleted

## 2011-06-29 DIAGNOSIS — K529 Noninfective gastroenteritis and colitis, unspecified: Secondary | ICD-10-CM

## 2011-06-29 DIAGNOSIS — K5289 Other specified noninfective gastroenteritis and colitis: Secondary | ICD-10-CM

## 2011-06-29 LAB — POCT PREGNANCY, URINE: Preg Test, Ur: NEGATIVE

## 2011-06-29 LAB — POCT URINALYSIS DIP (DEVICE)
Bilirubin Urine: NEGATIVE
Ketones, ur: NEGATIVE mg/dL
Leukocytes, UA: NEGATIVE

## 2011-06-29 MED ORDER — ONDANSETRON HCL 4 MG PO TABS: 4.0000 mg | ORAL_TABLET | Freq: Three times a day (TID) | ORAL | Status: AC | PRN

## 2011-06-29 MED ORDER — DIPHENOXYLATE-ATROPINE 2.5-0.025 MG PO TABS: 1.0000 | ORAL_TABLET | Freq: Four times a day (QID) | ORAL | Status: AC | PRN

## 2011-06-29 NOTE — ED Notes (Signed)
Pt with onset of fever/nausea and vomiting/diarrhea/abdominal cramping Monday night - vomiting resolved - remains with diarrhea/nausea - taking kaopectate diarrhea has slowed - 3 episodes today -

## 2011-06-29 NOTE — ED Provider Notes (Signed)
History     CSN: [id number]  Arrival date & time 06/29/11  1129   First MD Initiated Contact with Patient 06/29/11 1220      Chief Complaint  Patient presents with  . Diarrhea  . Emesis  . Nausea  . Abdominal Cramping    (Consider location/radiation/quality/duration/timing/severity/associated sxs/prior treatment) HPI Comments: [name] [name] presents for evaluation of nausea, vomiting, and watery diarrhea since Monday. She denies any new food exposure or fever, and no sick contacts. She states that the vomiting has resolved. She had diarrhea this morning. She has not eaten anything over the last two days but is tolerating liquids.   Patient is a 31 y.o. female presenting with diarrhea. The history is provided by the patient.  Diarrhea The primary symptoms include abdominal pain, nausea, vomiting and diarrhea. Primary symptoms do not include fever or dysuria. The illness began 2 days ago. The onset was sudden. The problem has not changed since onset. Nausea began 2 days ago. The nausea is associated with eating. The nausea is exacerbated by food.  The vomiting began 2 days ago. Vomiting occurs 2 to 5 times per day.  The diarrhea began 2 days ago. The diarrhea is watery and semi-solid.  The illness does not include chills or odynophagia.    History reviewed. No pertinent past medical history.  Past Surgical History  Procedure Date  . Cholecystectomy   . Pilonidal cyst excision     Family History  Problem Relation Age of Onset  . Hypertension Father   . Cancer Other   . Diabetes Other     History  Substance Use Topics  . Smoking status: Current Everyday Smoker  . Smokeless tobacco: Not on file  . Alcohol Use: No    OB History    Grav Para Term Preterm Abortions TAB SAB Ect Mult Living                  Review of Systems  Constitutional: Negative.  Negative for fever and chills.  HENT: Negative.   Eyes: Negative.   Respiratory: Negative.   Cardiovascular: Negative.     Gastrointestinal: Positive for nausea, vomiting, abdominal pain and diarrhea.  Genitourinary: Negative.  Negative for dysuria.  Musculoskeletal: Negative.   Skin: Negative.   Neurological: Negative.     Allergies  Review of patient's allergies indicates no known allergies.  Home Medications   Current Outpatient Rx  Name Route Sig Dispense Refill  . KAOPECTATE PO Oral Take by mouth.    Marland Kitchen DIPHENOXYLATE-ATROPINE 2.5-0.025 MG PO TABS Oral Take 1 tablet by mouth 4 (four) times daily as needed for diarrhea or loose stools. 30 tablet 0  . ONDANSETRON HCL 4 MG PO TABS Oral Take 1 tablet (4 mg total) by mouth every 8 (eight) hours as needed for nausea. 15 tablet 0    BP 116/80  Pulse 90  Temp(Src) 99.3 F (37.4 C) (Oral)  Resp 20  SpO2 100%  LMP 06/01/2011  Physical Exam  Nursing note and vitals reviewed. Constitutional: She is oriented to person, place, and time. She appears well-developed and well-nourished.  HENT:  Head: Normocephalic and atraumatic.  Eyes: EOM are normal.  Neck: Normal range of motion.  Pulmonary/Chest: Effort normal.  Abdominal: There is tenderness.    Musculoskeletal: Normal range of motion.  Neurological: She is alert and oriented to person, place, and time.  Skin: Skin is warm and dry.  Psychiatric: Her behavior is normal.    ED Course  Procedures (including critical care  time)  Labs Reviewed  POCT URINALYSIS DIP (DEVICE) - Abnormal; Notable for the following:    Hgb urine dipstick TRACE (*)    All other components within normal limits  POCT PREGNANCY, URINE   No results found.   1. Gastroenteritis       MDM  rx for ondansetron and Lomotil given, with bland BRAT diet and instructions        Richardo Priest, MD 06/29/11 1334

## 2012-06-08 ENCOUNTER — Emergency Department (HOSPITAL_COMMUNITY)
Admission: EM | Admit: 2012-06-08 | Discharge: 2012-06-08 | Disposition: A | Discharge status: Home / Self Care | Payer: 59 | Source: Home / Self Care

## 2012-06-08 ENCOUNTER — Encounter (HOSPITAL_COMMUNITY): Payer: Self-pay | Admitting: Emergency Medicine

## 2012-06-08 DIAGNOSIS — J069 Acute upper respiratory infection, unspecified: Secondary | ICD-10-CM

## 2012-06-08 MED ORDER — GUAIFENESIN-CODEINE 100-10 MG/5ML PO SYRP: 5.0000 mL | ORAL_SOLUTION | Freq: Three times a day (TID) | ORAL | Status: DC | PRN

## 2012-06-08 NOTE — ED Notes (Signed)
Waiting discharge papers 

## 2012-06-08 NOTE — ED Notes (Signed)
Pt c/o productive cough with yellow sputum. Scratchy throat. Fever x 2 days. Runny nose and body aches. Pt states that she has been using thera flu with some relief of symptoms.

## 2012-06-08 NOTE — ED Provider Notes (Signed)
[name] [name] is a 32 y.o. female who presents to Urgent Care today for cough, congestion, runny nose, sore throat and body aches for the last 3-4 days. Patient notes that she is a fever until yesterday.  The fever has resolved. She notes that TheraFlu has helped her symptoms.  She denies any trouble breathing nausea vomiting diarrhea chest pain or palpitations.  She notes that she did get a flu shot this year. She notes that contacts at work. She was able to go to work today.    PMH reviewed. Otherwise healthy History  Substance Use Topics  . Smoking status: Current Every Day Smoker  . Smokeless tobacco: Not on file  . Alcohol Use: No   ROS as above Medications reviewed. No current facility-administered medications for this encounter.   Current Outpatient Prescriptions  Medication Sig Dispense Refill  . Attapulgite (KAOPECTATE PO) Take by mouth.      Marland Kitchen guaiFENesin-codeine (ROBITUSSIN AC) 100-10 MG/5ML syrup Take 5 mLs by mouth 3 (three) times daily as needed for cough.  120 mL  0    Exam:  BP 133/95  Pulse 89  Temp 99.1 F (37.3 C) (Oral)  Resp 18  SpO2 99% Gen: Well NAD HEENT: EOMI,  MMM, normal posterior pharynx. Normal tympanic membranes bilaterally Lungs: CTABL Nl WOB Heart: RRR no MRG Abd: NABS, NT, ND Exts: Non edematous BL  LE, warm and well perfused.   No results found for this or any previous visit (from the past 24 hour(s)). No results found.  Assessment and Plan: 32 y.o. female with viral URI with cough.  Plan for symptomatic treatment with over-the-counter TheraFlu or other Tylenol or NSAID.  Additionally we will treat cough with codeine containing cough medicine.  Discussed warning signs or symptoms. Please see discharge instructions. Patient expresses understanding. Followup as needed     Rodolph Bong, MD 06/08/12 [phone]

## 2012-06-08 NOTE — ED Provider Notes (Signed)
Medical screening examination/treatment/procedure(s) were performed by a resident physician and as supervising physician I was immediately available for consultation/collaboration.  Leslee Home, M.D.   Reuben Likes, MD 06/08/12 [phone]

## 2013-06-07 ENCOUNTER — Other Ambulatory Visit: Payer: Self-pay | Admitting: Podiatry

## 2013-06-07 MED ORDER — NAPROXEN 500 MG PO TABS: 500.0000 mg | ORAL_TABLET | Freq: Two times a day (BID) | ORAL | Status: DC

## 2016-05-05 ENCOUNTER — Ambulatory Visit: Payer: Self-pay

## 2016-05-05 DIAGNOSIS — Z3490 Encounter for supervision of normal pregnancy, unspecified, unspecified trimester: Secondary | ICD-10-CM

## 2016-05-05 LAB — POCT PREGNANCY, URINE: Preg Test, Ur: POSITIVE — AB

## 2016-05-05 NOTE — Progress Notes (Signed)
Pt is taking prenatal gummy vit.   Pregnancy wheel and LMP states that the patient is 7163w4d and EDD 08/14/16   See patient in 2-3wks or patient has option to go to health dept  Pt states feeling the baby move per doctor ordered patient a US on Monday.

## 2016-05-09 ENCOUNTER — Ambulatory Visit (HOSPITAL_COMMUNITY)
Admission: RE | Admit: 2016-05-09 | Discharge: 2016-05-09 | Disposition: A | Discharge status: Home / Self Care | Payer: BLUE CROSS/BLUE SHIELD | Source: Ambulatory Visit | Attending: Obstetrics & Gynecology | Admitting: Obstetrics & Gynecology

## 2016-05-09 DIAGNOSIS — Z3490 Encounter for supervision of normal pregnancy, unspecified, unspecified trimester: Secondary | ICD-10-CM

## 2016-05-09 DIAGNOSIS — Z3A26 26 weeks gestation of pregnancy: Secondary | ICD-10-CM | POA: Insufficient documentation

## 2016-05-09 DIAGNOSIS — Z363 Encounter for antenatal screening for malformations: Secondary | ICD-10-CM | POA: Diagnosis not present

## 2016-05-19 ENCOUNTER — Encounter: Payer: Self-pay | Admitting: Certified Nurse Midwife

## 2016-05-19 ENCOUNTER — Ambulatory Visit (INDEPENDENT_AMBULATORY_CARE_PROVIDER_SITE_OTHER): Payer: BLUE CROSS/BLUE SHIELD | Admitting: Certified Nurse Midwife

## 2016-05-19 VITALS — BP 127/72 | HR 88 | Ht 65.0 in | Wt 237.0 lb

## 2016-05-19 DIAGNOSIS — O0930 Supervision of pregnancy with insufficient antenatal care, unspecified trimester: Secondary | ICD-10-CM | POA: Insufficient documentation

## 2016-05-19 DIAGNOSIS — Z23 Encounter for immunization: Secondary | ICD-10-CM

## 2016-05-19 DIAGNOSIS — O99212 Obesity complicating pregnancy, second trimester: Secondary | ICD-10-CM

## 2016-05-19 DIAGNOSIS — Z124 Encounter for screening for malignant neoplasm of cervix: Secondary | ICD-10-CM

## 2016-05-19 DIAGNOSIS — O0932 Supervision of pregnancy with insufficient antenatal care, second trimester: Secondary | ICD-10-CM

## 2016-05-19 DIAGNOSIS — O99210 Obesity complicating pregnancy, unspecified trimester: Secondary | ICD-10-CM | POA: Insufficient documentation

## 2016-05-19 DIAGNOSIS — Z113 Encounter for screening for infections with a predominantly sexual mode of transmission: Secondary | ICD-10-CM

## 2016-05-19 DIAGNOSIS — E669 Obesity, unspecified: Secondary | ICD-10-CM

## 2016-05-19 DIAGNOSIS — Z3492 Encounter for supervision of normal pregnancy, unspecified, second trimester: Secondary | ICD-10-CM

## 2016-05-19 DIAGNOSIS — Z3490 Encounter for supervision of normal pregnancy, unspecified, unspecified trimester: Secondary | ICD-10-CM | POA: Insufficient documentation

## 2016-05-19 DIAGNOSIS — Z3493 Encounter for supervision of normal pregnancy, unspecified, third trimester: Secondary | ICD-10-CM

## 2016-05-19 DIAGNOSIS — Z1151 Encounter for screening for human papillomavirus (HPV): Secondary | ICD-10-CM

## 2016-05-19 NOTE — Progress Notes (Signed)
Subjective:  [name] [name] is a 35 y.o. G3P1011 at [redacted]w[redacted]d being seen today for initial prenatal care visit.  She is currently monitored for the following issues for this low-risk pregnancy and has Supervision of low-risk pregnancy; Late prenatal care affecting pregnancy, antepartum; and Obesity affecting pregnancy, antepartum on her problem list.  Patient reports no complaints.  Contractions: Not present. Vag. Bleeding: None.  Movement: Present. Denies leaking of fluid.   The following portions of the patient's history were reviewed and updated as appropriate: allergies, current medications, past family history, past medical history, past social history, past surgical history and problem list. Problem list updated.  Objective:   Vitals:   05/19/16 0825 05/19/16 0826  BP: 127/72   Pulse: 88   Weight: 237 lb (107.5 kg)   Height:  5\' 5"  (1.651 m)    Fetal Status: Fetal Heart Rate (bpm): 135 Fundal Height: 31 cm Movement: Present  Presentation: Undeterminable  General:  Alert, oriented and cooperative. Patient is in no acute distress.  Skin: Skin is warm and dry. No rash noted.   Cardiovascular: Normal heart rate noted  Respiratory: Normal respiratory effort, no problems with respiration noted  Abdomen: Soft, gravid, appropriate for gestational age. Pain/Pressure: Present     Pelvic: Vag. Bleeding: None     Cervical exam performed Dilation: Closed Effacement (%): 0    Extremities: Normal range of motion.  Edema: Trace  Mental Status: Normal mood and affect. Normal behavior. Normal judgment and thought content.  HEENT: nml  Urinalysis:      Assessment and Plan:  Pregnancy: G3P1011 at [redacted]w[redacted]d  1. Encounter for supervision of low-risk pregnancy in third trimester  - Prenatal Profile - Hemoglobinopathy Evaluation - Culture, OB Urine - Pain Mgmt, Profile 6 Conf w/o mM, U - Cytology - PAP - Flu Vaccine QUAD 36+ mos IM (Fluarix, Quad PF) - Tdap vaccine greater than or equal to 7yo  IM  2. Late prenatal care affecting pregnancy, antepartum -2 hr GTT next visit  3. Obesity affecting pregnancy, antepartum -watch fundal height (S>D), may be d/t habitus  Preterm labor symptoms and general obstetric precautions including but not limited to vaginal bleeding, contractions, leaking of fluid and fetal movement were reviewed in detail with the patient. Please refer to After Visit Summary for other counseling recommendations.  Return in about 2 weeks (around 06/02/2016).   [name] [name], CNM

## 2016-05-20 LAB — HEMOGLOBINOPATHY EVALUATION
HEMATOCRIT: 30.3 % — AB (ref 35.0–45.0)
HEMOGLOBIN: 10 g/dL — AB (ref 11.7–15.5)
HGB A: 96.7 % (ref >96.0)
Hgb A2 Quant: 2.3 % (ref 1.8–3.5)
Hgb F Quant: <1 % (ref <2.0)
MCH: 26.9 pg — AB (ref 27.0–33.0)
MCV: 81.5 fL (ref 80.0–100.0)
RBC: 3.72 MIL/uL — AB (ref 3.80–5.10)
RDW: 14.2 % (ref 11.0–15.0)

## 2016-05-20 LAB — PRENATAL PROFILE (SOLSTAS)
Antibody Screen: NEGATIVE
BASOS ABS: 0 {cells}/uL (ref 0–200)
Basophils Relative: 0 %
EOS ABS: 75 {cells}/uL (ref 15–500)
EOS PCT: 1 %
HCT: 30.3 % — ABNORMAL LOW (ref 35.0–45.0)
HEMOGLOBIN: 10 g/dL — AB (ref 11.7–15.5)
HEP B S AG: NEGATIVE
HIV: NONREACTIVE
LYMPHS ABS: 1350 {cells}/uL (ref 850–3900)
Lymphocytes Relative: 18 %
MCH: 26.9 pg — AB (ref 27.0–33.0)
MCHC: 33 g/dL (ref 32.0–36.0)
MCV: 81.5 fL (ref 80.0–100.0)
MPV: 10.5 fL (ref 7.5–12.5)
Monocytes Absolute: 600 cells/uL (ref 200–950)
Monocytes Relative: 8 %
NEUTROS ABS: 5475 {cells}/uL (ref 1500–7800)
NEUTROS PCT: 73 %
PLATELETS: 293 10*3/uL (ref 140–400)
RBC: 3.72 MIL/uL — ABNORMAL LOW (ref 3.80–5.10)
RDW: 14.2 % (ref 11.0–15.0)
RUBELLA: 2.15 {index} — AB (ref <0.90)
Rh Type: POSITIVE
WBC: 7.5 10*3/uL (ref 3.8–10.8)

## 2016-05-20 LAB — CYTOLOGY - PAP
Chlamydia: NEGATIVE
DIAGNOSIS: NEGATIVE
HPV: NOT DETECTED
Neisseria Gonorrhea: NEGATIVE

## 2016-05-20 LAB — PAIN MGMT, PROFILE 6 CONF W/O MM, U
6 ACETYLMORPHINE: NEGATIVE ng/mL (ref <10)
ALCOHOL METABOLITES: NEGATIVE ng/mL (ref <500)
Amphetamines: NEGATIVE ng/mL (ref <500)
Barbiturates: NEGATIVE ng/mL (ref <300)
Benzodiazepines: NEGATIVE ng/mL (ref <100)
COCAINE METABOLITE: NEGATIVE ng/mL (ref <150)
Creatinine: 143.7 mg/dL (ref >=20.0)
MARIJUANA METABOLITE: NEGATIVE ng/mL (ref <20)
Methadone Metabolite: NEGATIVE ng/mL (ref <100)
OPIATES: NEGATIVE ng/mL (ref <100)
OXIDANT: NEGATIVE ug/mL (ref <200)
Oxycodone: NEGATIVE ng/mL (ref <100)
PLEASE NOTE: 0
Phencyclidine: NEGATIVE ng/mL (ref <25)
pH: 7.15 (ref 4.5–9.0)

## 2016-05-20 LAB — CULTURE, OB URINE: Organism ID, Bacteria: NO GROWTH

## 2016-05-25 ENCOUNTER — Other Ambulatory Visit: Payer: Self-pay | Admitting: General Practice

## 2016-05-25 ENCOUNTER — Encounter: Payer: Self-pay | Admitting: General Practice

## 2016-05-25 DIAGNOSIS — O99013 Anemia complicating pregnancy, third trimester: Secondary | ICD-10-CM

## 2016-05-25 MED ORDER — POLYSACCHARIDE IRON COMPLEX 150 MG PO CAPS: 150.0000 mg | ORAL_CAPSULE | Freq: Every day | ORAL | 3 refills | Status: DC

## 2016-05-30 NOTE — L&D Delivery Note (Signed)
Delivery Note Admitted for IOL for gestational hypertension.  Had complicated course - started induction with cytotec afternoon of 3/5, transitioned to pitocin overnight.  Foley bulb placed 3/6 AM and stopped pitocin/restarted cytotec.  Again transitioned to pitocin overnight.  Foley bulb out this morning (3/7), pitocin continued.  After AROM this afternoon developed acute chest pain; quickly resolved, EKG normal, never recurred.  However, developed severe range blood pressures and Cr increased so she was started on mag this afternoon.  She progressed to complete and was ready to push this evening around 8PM.  At 8:18 PM a viable female was delivered via Vaginal, Spontaneous Delivery (Presentation: LOA).  APGAR: 7, 9; weight 7 lb 12.4 oz (3527 g).   Placenta status: spontaneously delivered.  Cord: 3VC.  Cord gasses not collected.  Anesthesia:  epidural Episiotomy: None Lacerations: 1st degree sulcal,not repaired Est. Blood Loss (mL): 250  Mom to postpartum.  Baby to Couplet care / Skin to Skin.  Continue mag for 24 hours postpartum.  Charlsie MerlesJulia Rhoden 08/03/2016, 8:38 PM  OB FELLOW DELIVERY ATTESTATION  I was gloved and present for the delivery in its entirety, and I agree with the above resident's note.    Ernestina PennaNicholas Schenk, MD 8:45 PM

## 2016-06-02 ENCOUNTER — Ambulatory Visit (INDEPENDENT_AMBULATORY_CARE_PROVIDER_SITE_OTHER): Payer: BLUE CROSS/BLUE SHIELD | Admitting: Student

## 2016-06-02 VITALS — BP 129/74 | HR 92 | Wt 236.3 lb

## 2016-06-02 DIAGNOSIS — A5901 Trichomonal vulvovaginitis: Secondary | ICD-10-CM | POA: Insufficient documentation

## 2016-06-02 DIAGNOSIS — O23593 Infection of other part of genital tract in pregnancy, third trimester: Secondary | ICD-10-CM

## 2016-06-02 DIAGNOSIS — Z3493 Encounter for supervision of normal pregnancy, unspecified, third trimester: Secondary | ICD-10-CM

## 2016-06-02 MED ORDER — METRONIDAZOLE 500 MG PO TABS: 2000.0000 mg | ORAL_TABLET | Freq: Once | ORAL | 0 refills | Status: AC

## 2016-06-02 NOTE — Progress Notes (Signed)
   PRENATAL VISIT NOTE  Subjective:  [name] [name] is a 36 y.o. G3P1011 at 865w4d being seen today for ongoing prenatal care.  She is currently monitored for the following issues for this low-risk pregnancy and has Supervision of low-risk pregnancy; Late prenatal care affecting pregnancy, antepartum; Obesity affecting pregnancy, antepartum; and Trichomonal vaginitis during pregnancy in third trimester on her problem list.  Patient reports no complaints.  Contractions: Not present. Vag. Bleeding: None.  Movement: Present. Denies leaking of fluid.   The following portions of the patient's history were reviewed and updated as appropriate: allergies, current medications, past family history, past medical history, past social history, past surgical history and problem list. Problem list updated.  Objective:   Vitals:   06/02/16 1320  BP: 129/74  Pulse: 92  Weight: 236 lb 4.8 oz (107.2 kg)    Fetal Status: Fetal Heart Rate (bpm): 147 Fundal Height: 31 cm Movement: Present     General:  Alert, oriented and cooperative. Patient is in no acute distress.  Skin: Skin is warm and dry. No rash noted.   Cardiovascular: Normal heart rate noted  Respiratory: Normal respiratory effort, no problems with respiration noted  Abdomen: Soft, gravid, appropriate for gestational age. Pain/Pressure: Absent     Pelvic:  Cervical exam deferred        Extremities: Normal range of motion.  Edema: Trace  Mental Status: Normal mood and affect. Normal behavior. Normal judgment and thought content.   Assessment and Plan:  Pregnancy: G3P1011 at 3265w4d  1. Encounter for supervision of low-risk pregnancy in third trimester  - 2Hr GTT w/ 1 Hr Carpenter 75 g  2. Trichomonal vaginitis during pregnancy in third trimester -Pt notified of results -- rx sent to pharmacy -- no IC x 1 week after both her & partner treated -- [facility] - metroNIDAZOLE (FLAGYL) 500 MG tablet; Take 4 tablets (2,000 mg total)  by mouth once.  Dispense: 4 tablet; Refill: 0  Preterm labor symptoms and general obstetric precautions including but not limited to vaginal bleeding, contractions, leaking of fluid and fetal movement were reviewed in detail with the patient. Please refer to After Visit Summary for other counseling recommendations.  Return in about 2 weeks (around 06/16/2016) for Routine OB.   Judeth HornErin Loxley Schmale, NP

## 2016-06-02 NOTE — Patient Instructions (Signed)
Trichomoniasis Trichomoniasis is an infection caused by an organism called Trichomonas. The infection can affect both women and men. In women, the outer female genitalia and the vagina are affected. In men, the penis is mainly affected, but the prostate and other reproductive organs can also be involved. Trichomoniasis is a sexually transmitted infection (STI) and is most often passed to another person through sexual contact.  RISK FACTORS  Having unprotected sexual intercourse.  Having sexual intercourse with an infected partner. SIGNS AND SYMPTOMS  Symptoms of trichomoniasis in women include:  Abnormal gray-green frothy vaginal discharge.  Itching and irritation of the vagina.  Itching and irritation of the area outside the vagina. Symptoms of trichomoniasis in men include:   Penile discharge with or without pain.  Pain during urination. This results from inflammation of the urethra. DIAGNOSIS  Trichomoniasis may be found during a Pap test or physical exam. Your health care provider may use one of the following methods to help diagnose this infection:  Testing the pH of the vagina with a test tape.  Using a vaginal swab test that checks for the Trichomonas organism. A test is available that provides results within a few minutes.  Examining a urine sample.  Testing vaginal secretions. Your health care provider may test you for other STIs, including HIV. TREATMENT   You may be given medicine to fight the infection. Women should inform their health care provider if they could be or are pregnant. Some medicines used to treat the infection should not be taken during pregnancy.  Your health care provider may recommend over-the-counter medicines or creams to decrease itching or irritation.  Your sexual partner will need to be treated if infected.  Your health care provider may test you for infection again 3 months after treatment. HOME CARE INSTRUCTIONS   Take medicines only as  directed by your health care provider.  Take over-the-counter medicine for itching or irritation as directed by your health care provider.  Do not have sexual intercourse while you have the infection.  Women should not douche or wear tampons while they have the infection.  Discuss your infection with your partner. Your partner may have gotten the infection from you, or you may have gotten it from your partner.  Have your sex partner get examined and treated if necessary.  Practice safe, informed, and protected sex.  See your health care provider for other STI testing. SEEK MEDICAL CARE IF:   You still have symptoms after you finish your medicine.  You develop abdominal pain.  You have pain when you urinate.  You have bleeding after sexual intercourse.  You develop a rash.  Your medicine makes you sick or makes you throw up (vomit). MAKE SURE YOU:  Understand these instructions.  Will watch your condition.  Will get help right away if you are not doing well or get worse. This information is not intended to replace advice given to you by your health care provider. Make sure you discuss any questions you have with your health care provider. Document Released: 11/09/2000 Document Revised: 06/06/2014 Document Reviewed: 02/25/2013 Elsevier Interactive Patient Education  2017 [employer] Third Trimester of Pregnancy The third trimester is from week 29 through week 42, months 7 through 9. This trimester is when your unborn baby (fetus) is growing very fast. At the end of the ninth month, the unborn baby is about 20 inches in length. It weighs about 6-10 pounds. Follow these instructions at home:  Avoid all smoking, herbs, and alcohol.  Avoid drugs not approved by your doctor.  Do not use any tobacco products, including cigarettes, chewing tobacco, and electronic cigarettes. If you need help quitting, ask your doctor. You may get counseling or other support to help you  quit.  Only take medicine as told by your doctor. Some medicines are safe and some are not during pregnancy.  Exercise only as told by your doctor. Stop exercising if you start having cramps.  Eat regular, healthy meals.  Wear a good support bra if your breasts are tender.  Do not use hot tubs, steam rooms, or saunas.  Wear your seat belt when driving.  Avoid raw meat, uncooked cheese, and liter boxes and soil used by cats.  Take your prenatal vitamins.  Take 1500-2000 milligrams of calcium daily starting at the 20th week of pregnancy until you deliver your baby.  Try taking medicine that helps you poop (stool softener) as needed, and if your doctor approves. Eat more fiber by eating fresh fruit, vegetables, and whole grains. Drink enough fluids to keep your pee (urine) clear or pale yellow.  Take warm water baths (sitz baths) to soothe pain or discomfort caused by hemorrhoids. Use hemorrhoid cream if your doctor approves.  If you have puffy, bulging veins (varicose veins), wear support hose. Raise (elevate) your feet for 15 minutes, 3-4 times a day. Limit salt in your diet.  Avoid heavy lifting, wear low heels, and sit up straight.  Rest with your legs raised if you have leg cramps or low back pain.  Visit your dentist if you have not gone during your pregnancy. Use a soft toothbrush to brush your teeth. Be gentle when you floss.  You can have sex (intercourse) unless your doctor tells you not to.  Do not travel far distances unless you must. Only do so with your doctor's approval.  Take prenatal classes.  Practice driving to the hospital.  Pack your hospital bag.  Prepare the baby's room.  Go to your doctor visits. Get help if:  You are not sure if you are in labor or if your water has broken.  You are dizzy.  You have mild cramps or pressure in your lower belly (abdominal).  You have a nagging pain in your belly area.  You continue to feel sick to your  stomach (nauseous), throw up (vomit), or have watery poop (diarrhea).  You have bad smelling fluid coming from your vagina.  You have pain with peeing (urination). Get help right away if:  You have a fever.  You are leaking fluid from your vagina.  You are spotting or bleeding from your vagina.  You have severe belly cramping or pain.  You lose or gain weight rapidly.  You have trouble catching your breath and have chest pain.  You notice sudden or extreme puffiness (swelling) of your face, hands, ankles, feet, or legs.  You have not felt the baby move in over an hour.  You have severe headaches that do not go away with medicine.  You have vision changes. This information is not intended to replace advice given to you by your health care provider. Make sure you discuss any questions you have with your health care provider. Document Released: 08/10/2009 Document Revised: 10/22/2015 Document Reviewed: 07/17/2012 Elsevier Interactive Patient Education  2017 [employer].

## 2016-06-03 LAB — 2HR GTT W 1 HR, CARPENTER, 75 G
Glucose, 1 Hr, Gest: 106 mg/dL (ref <180)
Glucose, 2 Hr, Gest: 106 mg/dL (ref <153)
Glucose, Fasting, Gest: 63 mg/dL — ABNORMAL LOW (ref 65–91)

## 2016-06-05 ENCOUNTER — Encounter: Payer: Self-pay | Admitting: Obstetrics and Gynecology

## 2016-06-16 ENCOUNTER — Encounter: Payer: BLUE CROSS/BLUE SHIELD | Admitting: Obstetrics and Gynecology

## 2016-06-27 ENCOUNTER — Ambulatory Visit (INDEPENDENT_AMBULATORY_CARE_PROVIDER_SITE_OTHER): Payer: BLUE CROSS/BLUE SHIELD | Admitting: Obstetrics and Gynecology

## 2016-06-27 VITALS — BP 125/70 | HR 96 | Wt 241.1 lb

## 2016-06-27 DIAGNOSIS — Z3493 Encounter for supervision of normal pregnancy, unspecified, third trimester: Secondary | ICD-10-CM

## 2016-06-27 DIAGNOSIS — O2240 Hemorrhoids in pregnancy, unspecified trimester: Secondary | ICD-10-CM

## 2016-06-27 DIAGNOSIS — O2243 Hemorrhoids in pregnancy, third trimester: Secondary | ICD-10-CM

## 2016-06-27 NOTE — Progress Notes (Signed)
Discussed weight gain is above recommended and discussed stratagies. C/o hemorrhoids- denies constipation. Discussed measures to use.

## 2016-06-27 NOTE — Progress Notes (Signed)
   PRENATAL VISIT NOTE  Subjective:  [name] [name] is a 36 y.o. G3P1011 at 4812w1d being seen today for ongoing prenatal care.  She is currently monitored for the following issues for this low-risk pregnancy and has Supervision of low-risk pregnancy; Late prenatal care affecting pregnancy, antepartum; Obesity affecting pregnancy, antepartum; Trichomonal vaginitis during pregnancy in third trimester; and Hemorrhoids during pregnancy, antepartum on her problem list.  Patient reports no complaints.  Contractions: Not present. Vag. Bleeding: None.  Movement: Present. Denies leaking of fluid.   The following portions of the patient's history were reviewed and updated as appropriate: allergies, current medications, past family history, past medical history, past social history, past surgical history and problem list. Problem list updated.  Objective:   Vitals:   06/27/16 0849  BP: 125/70  Pulse: 96  Weight: 241 lb 1.6 oz (109.4 kg)    Fetal Status: Fetal Heart Rate (bpm): 131 Fundal Height: 34 cm Movement: Present     General:  Alert, oriented and cooperative. Patient is in no acute distress.  Skin: Skin is warm and dry. No rash noted.   Cardiovascular: Normal heart rate noted  Respiratory: Normal respiratory effort, no problems with respiration noted  Abdomen: Soft, gravid, appropriate for gestational age. Pain/Pressure: Absent     Pelvic:  Cervical exam deferred        Extremities: Normal range of motion.  Edema: Trace  Mental Status: Normal mood and affect. Normal behavior. Normal judgment and thought content.   Assessment and Plan:  Pregnancy: G3P1011 at 3612w1d  1. Encounter for supervision of low-risk pregnancy in third trimester  - Will need TOC next visit. Treated for Trichomonas on 1/4 - Discussed 2 hour GTT- WNL   Preterm labor symptoms and general obstetric precautions including but not limited to vaginal bleeding, contractions, leaking of fluid and fetal movement were reviewed  in detail with the patient. Please refer to After Visit Summary for other counseling recommendations.  Return in about 2 weeks (around 07/11/2016).   Duane LopeJennifer I Rasch, NP

## 2016-07-14 ENCOUNTER — Encounter: Payer: BLUE CROSS/BLUE SHIELD | Admitting: Student

## 2016-07-21 ENCOUNTER — Ambulatory Visit (INDEPENDENT_AMBULATORY_CARE_PROVIDER_SITE_OTHER): Payer: BLUE CROSS/BLUE SHIELD | Admitting: Advanced Practice Midwife

## 2016-07-21 VITALS — BP 139/72 | HR 92 | Wt 246.1 lb

## 2016-07-21 DIAGNOSIS — Z113 Encounter for screening for infections with a predominantly sexual mode of transmission: Secondary | ICD-10-CM

## 2016-07-21 DIAGNOSIS — Z3493 Encounter for supervision of normal pregnancy, unspecified, third trimester: Secondary | ICD-10-CM

## 2016-07-21 DIAGNOSIS — O1203 Gestational edema, third trimester: Secondary | ICD-10-CM

## 2016-07-21 DIAGNOSIS — O26843 Uterine size-date discrepancy, third trimester: Secondary | ICD-10-CM

## 2016-07-21 LAB — OB RESULTS CONSOLE GBS: GBS: NEGATIVE

## 2016-07-21 LAB — OB RESULTS CONSOLE GC/CHLAMYDIA: GC PROBE AMP, GENITAL: NEGATIVE

## 2016-07-21 NOTE — Progress Notes (Signed)
   PRENATAL VISIT NOTE  Subjective:  [name] [name] is a 36 y.o. G3P1011 at 4266w4d being seen today for ongoing prenatal care.  She is currently monitored for the following issues for this low-risk pregnancy and has Supervision of low-risk pregnancy; Late prenatal care affecting pregnancy, antepartum; Obesity affecting pregnancy, antepartum; Trichomonal vaginitis during pregnancy in third trimester; and Hemorrhoids during pregnancy, antepartum on her problem list.  Patient reports backache and occasional contractions.  Contractions: Irritability.  .  Movement: Present. Denies leaking of fluid.   The following portions of the patient's history were reviewed and updated as appropriate: allergies, current medications, past family history, past medical history, past social history, past surgical history and problem list. Problem list updated.  Objective:   Vitals:   07/21/16 1606  BP: 139/72  Pulse: 92  Weight: 246 lb 1.6 oz (111.6 kg)    Fetal Status: Fetal Heart Rate (bpm): 41 Fundal Height: 41 cm Movement: Present  Presentation: Vertex  General:  Alert, oriented and cooperative. Patient is in no acute distress.  Skin: Skin is warm and dry. No rash noted.   Cardiovascular: Normal heart rate noted  Respiratory: Normal respiratory effort, no problems with respiration noted  Abdomen: Soft, gravid, appropriate for gestational age. Pain/Pressure: Present     Pelvic:  Cervical exam performed Dilation: 1 Effacement (%): 70 Station: -3  Extremities: Normal range of motion.  Edema: Trace  Mental Status: Normal mood and affect. Normal behavior. Normal judgment and thought content.   Assessment and Plan:  Pregnancy: G3P1011 at 2166w4d  1. Encounter for supervision of low-risk pregnancy in third trimester  - GC/Chlamydia probe amp ([facility] - Culture, beta strep (group b only)  2. Uterine size date discrepancy pregnancy, third trimester --FH 41 cm today at 3066w4d ga.  Pt feels like  baby is large.  Previous baby was 8lb8oz at 41 weeks. - US MFM OB FOLLOW UP; Future  3. Edema during pregnancy in third trimester --BP 139/72 today.  Denies h/a, epigastric pain, new onset visual disturbances.   --Reviewed s/sx of preeclampsia/reasons to come to hospital --Recommend elevation, increase PO fluids, decrease salt intake, support socks.  Term labor symptoms and general obstetric precautions including but not limited to vaginal bleeding, contractions, leaking of fluid and fetal movement were reviewed in detail with the patient. Please refer to After Visit Summary for other counseling recommendations.  No Follow-up on file.   Hurshel PartyLisa A Leftwich-Kirby, CNM

## 2016-07-22 LAB — GC/CHLAMYDIA PROBE AMP (~~LOC~~) NOT AT ARMC
CHLAMYDIA, DNA PROBE: NEGATIVE
Neisseria Gonorrhea: NEGATIVE

## 2016-07-25 LAB — CULTURE, BETA STREP (GROUP B ONLY): Strep Gp B Culture: NEGATIVE

## 2016-07-28 ENCOUNTER — Ambulatory Visit (HOSPITAL_COMMUNITY)
Admission: RE | Admit: 2016-07-28 | Discharge: 2016-07-28 | Disposition: A | Discharge status: Home / Self Care | Payer: BLUE CROSS/BLUE SHIELD | Source: Ambulatory Visit | Attending: Advanced Practice Midwife | Admitting: Advanced Practice Midwife

## 2016-07-28 ENCOUNTER — Encounter: Payer: Self-pay | Admitting: Advanced Practice Midwife

## 2016-07-28 DIAGNOSIS — Z3A37 37 weeks gestation of pregnancy: Secondary | ICD-10-CM | POA: Insufficient documentation

## 2016-07-28 DIAGNOSIS — O99213 Obesity complicating pregnancy, third trimester: Secondary | ICD-10-CM | POA: Diagnosis present

## 2016-07-28 DIAGNOSIS — O26843 Uterine size-date discrepancy, third trimester: Secondary | ICD-10-CM | POA: Insufficient documentation

## 2016-08-01 ENCOUNTER — Encounter (HOSPITAL_COMMUNITY): Payer: Self-pay | Admitting: *Deleted

## 2016-08-01 ENCOUNTER — Inpatient Hospital Stay (HOSPITAL_COMMUNITY)
Admission: AD | Admit: 2016-08-01 | Discharge: 2016-08-05 | DRG: 774 | Disposition: A | Discharge status: Home / Self Care | Payer: BLUE CROSS/BLUE SHIELD | Source: Ambulatory Visit | Attending: Obstetrics & Gynecology | Admitting: Obstetrics & Gynecology

## 2016-08-01 ENCOUNTER — Ambulatory Visit (INDEPENDENT_AMBULATORY_CARE_PROVIDER_SITE_OTHER): Payer: BLUE CROSS/BLUE SHIELD | Admitting: Medical

## 2016-08-01 VITALS — BP 148/68 | HR 103 | Wt 246.5 lb

## 2016-08-01 DIAGNOSIS — R079 Chest pain, unspecified: Secondary | ICD-10-CM | POA: Diagnosis not present

## 2016-08-01 DIAGNOSIS — O134 Gestational [pregnancy-induced] hypertension without significant proteinuria, complicating childbirth: Secondary | ICD-10-CM | POA: Diagnosis present

## 2016-08-01 DIAGNOSIS — O133 Gestational [pregnancy-induced] hypertension without significant proteinuria, third trimester: Secondary | ICD-10-CM

## 2016-08-01 DIAGNOSIS — O864 Pyrexia of unknown origin following delivery: Secondary | ICD-10-CM | POA: Diagnosis not present

## 2016-08-01 DIAGNOSIS — O2240 Hemorrhoids in pregnancy, unspecified trimester: Secondary | ICD-10-CM

## 2016-08-01 DIAGNOSIS — O26843 Uterine size-date discrepancy, third trimester: Secondary | ICD-10-CM | POA: Diagnosis present

## 2016-08-01 DIAGNOSIS — H53453 Other localized visual field defect, bilateral: Secondary | ICD-10-CM | POA: Diagnosis not present

## 2016-08-01 DIAGNOSIS — O23593 Infection of other part of genital tract in pregnancy, third trimester: Secondary | ICD-10-CM

## 2016-08-01 DIAGNOSIS — Z87891 Personal history of nicotine dependence: Secondary | ICD-10-CM | POA: Diagnosis not present

## 2016-08-01 DIAGNOSIS — Z3A38 38 weeks gestation of pregnancy: Secondary | ICD-10-CM

## 2016-08-01 DIAGNOSIS — O2243 Hemorrhoids in pregnancy, third trimester: Secondary | ICD-10-CM | POA: Diagnosis present

## 2016-08-01 DIAGNOSIS — O1414 Severe pre-eclampsia complicating childbirth: Secondary | ICD-10-CM | POA: Diagnosis not present

## 2016-08-01 DIAGNOSIS — O0930 Supervision of pregnancy with insufficient antenatal care, unspecified trimester: Secondary | ICD-10-CM

## 2016-08-01 DIAGNOSIS — A5901 Trichomonal vulvovaginitis: Secondary | ICD-10-CM

## 2016-08-01 DIAGNOSIS — O163 Unspecified maternal hypertension, third trimester: Secondary | ICD-10-CM

## 2016-08-01 DIAGNOSIS — O99210 Obesity complicating pregnancy, unspecified trimester: Secondary | ICD-10-CM

## 2016-08-01 DIAGNOSIS — Z3493 Encounter for supervision of normal pregnancy, unspecified, third trimester: Secondary | ICD-10-CM

## 2016-08-01 DIAGNOSIS — O139 Gestational [pregnancy-induced] hypertension without significant proteinuria, unspecified trimester: Secondary | ICD-10-CM | POA: Diagnosis present

## 2016-08-01 HISTORY — DX: Essential (primary) hypertension: I10

## 2016-08-01 LAB — COMPREHENSIVE METABOLIC PANEL
ALK PHOS: 148 U/L — AB (ref 38–126)
ALT: 15 U/L (ref 14–54)
ANION GAP: 5 (ref 5–15)
AST: 21 U/L (ref 15–41)
Albumin: 2.8 g/dL — ABNORMAL LOW (ref 3.5–5.0)
BILIRUBIN TOTAL: 0.1 mg/dL — AB (ref 0.3–1.2)
BUN: 5 mg/dL — ABNORMAL LOW (ref 6–20)
CALCIUM: 8.5 mg/dL — AB (ref 8.9–10.3)
CO2: 22 mmol/L (ref 22–32)
Chloride: 106 mmol/L (ref 101–111)
Creatinine, Ser: 0.49 mg/dL (ref 0.44–1.00)
Glucose, Bld: 86 mg/dL (ref 65–99)
Potassium: 3.2 mmol/L — ABNORMAL LOW (ref 3.5–5.1)
Sodium: 133 mmol/L — ABNORMAL LOW (ref 135–145)
TOTAL PROTEIN: 6.4 g/dL — AB (ref 6.5–8.1)

## 2016-08-01 LAB — CBC
HCT: 27.8 % — ABNORMAL LOW (ref 36.0–46.0)
Hemoglobin: 8.8 g/dL — ABNORMAL LOW (ref 12.0–15.0)
MCH: 24.4 pg — ABNORMAL LOW (ref 26.0–34.0)
MCHC: 31.7 g/dL (ref 30.0–36.0)
MCV: 77.2 fL — AB (ref 78.0–100.0)
PLATELETS: 266 10*3/uL (ref 150–400)
RBC: 3.6 MIL/uL — ABNORMAL LOW (ref 3.87–5.11)
RDW: 13.9 % (ref 11.5–15.5)
WBC: 9 10*3/uL (ref 4.0–10.5)

## 2016-08-01 LAB — TYPE AND SCREEN
ABO/RH(D): O POS
Antibody Screen: NEGATIVE

## 2016-08-01 LAB — PROTEIN / CREATININE RATIO, URINE
Creatinine, Urine: 165 mg/dL
PROTEIN CREATININE RATIO: 0.18 mg/mg{creat} — AB (ref 0.00–0.15)
TOTAL PROTEIN, URINE: 30 mg/dL

## 2016-08-01 LAB — ABO/RH: ABO/RH(D): O POS

## 2016-08-01 MED ORDER — FENTANYL 2.5 MCG/ML BUPIVACAINE 1/10 % EPIDURAL INFUSION (WH - ANES)
14.0000 mL/h | INTRAMUSCULAR | Status: DC | PRN
  Administered 2016-08-02 – 2016-08-03 (×9): 14 mL/h via EPIDURAL
  Filled 2016-08-01 (×8): qty 100

## 2016-08-01 MED ORDER — EPHEDRINE 5 MG/ML INJ
10.0000 mg | INTRAVENOUS | Status: DC | PRN
  Filled 2016-08-01: qty 4

## 2016-08-01 MED ORDER — OXYTOCIN 40 UNITS IN LACTATED RINGERS INFUSION - SIMPLE MED
1.0000 m[IU]/min | INTRAVENOUS | Status: DC
  Administered 2016-08-01: 2 m[IU]/min via INTRAVENOUS

## 2016-08-01 MED ORDER — SOD CITRATE-CITRIC ACID 500-334 MG/5ML PO SOLN: 30.0000 mL | ORAL | Status: DC | PRN

## 2016-08-01 MED ORDER — LIDOCAINE HCL (PF) 1 % IJ SOLN
30.0000 mL | INTRAMUSCULAR | Status: DC | PRN
  Filled 2016-08-01: qty 30

## 2016-08-01 MED ORDER — FENTANYL CITRATE (PF) 100 MCG/2ML IJ SOLN
100.0000 ug | INTRAMUSCULAR | Status: DC | PRN
  Administered 2016-08-01 (×2): 100 ug via INTRAVENOUS
  Filled 2016-08-01 (×2): qty 2

## 2016-08-01 MED ORDER — TERBUTALINE SULFATE 1 MG/ML IJ SOLN: 0.2500 mg | Freq: Once | INTRAMUSCULAR | Status: DC | PRN

## 2016-08-01 MED ORDER — LACTATED RINGERS IV SOLN
500.0000 mL | INTRAVENOUS | Status: DC | PRN
  Administered 2016-08-02 (×2): 500 mL via INTRAVENOUS

## 2016-08-01 MED ORDER — LACTATED RINGERS IV SOLN
INTRAVENOUS | Status: DC
  Administered 2016-08-01: 20:00:00 via INTRAVENOUS
  Administered 2016-08-02 (×2): 125 mL/h via INTRAVENOUS
  Administered 2016-08-02 – 2016-08-04 (×6): via INTRAVENOUS

## 2016-08-01 MED ORDER — ONDANSETRON HCL 4 MG/2ML IJ SOLN
4.0000 mg | Freq: Four times a day (QID) | INTRAMUSCULAR | Status: DC | PRN
  Administered 2016-08-02 – 2016-08-03 (×2): 4 mg via INTRAVENOUS
  Filled 2016-08-01 (×2): qty 2

## 2016-08-01 MED ORDER — OXYCODONE-ACETAMINOPHEN 5-325 MG PO TABS
2.0000 | ORAL_TABLET | ORAL | Status: DC | PRN
  Administered 2016-08-04 (×2): 2 via ORAL
  Filled 2016-08-01 (×2): qty 2

## 2016-08-01 MED ORDER — PHENYLEPHRINE 40 MCG/ML (10ML) SYRINGE FOR IV PUSH (FOR BLOOD PRESSURE SUPPORT)
80.0000 ug | PREFILLED_SYRINGE | INTRAVENOUS | Status: DC | PRN
  Filled 2016-08-01: qty 10
  Filled 2016-08-01: qty 5

## 2016-08-01 MED ORDER — OXYCODONE-ACETAMINOPHEN 5-325 MG PO TABS
1.0000 | ORAL_TABLET | ORAL | Status: DC | PRN
  Administered 2016-08-03: 1 via ORAL
  Filled 2016-08-01: qty 1

## 2016-08-01 MED ORDER — LACTATED RINGERS IV SOLN: 500.0000 mL | Freq: Once | INTRAVENOUS | Status: DC

## 2016-08-01 MED ORDER — MISOPROSTOL 25 MCG QUARTER TABLET
25.0000 ug | ORAL_TABLET | ORAL | Status: DC | PRN
  Administered 2016-08-02 (×2): 25 ug via VAGINAL
  Filled 2016-08-01: qty 0.25
  Filled 2016-08-01: qty 1
  Filled 2016-08-01: qty 0.25

## 2016-08-01 MED ORDER — ACETAMINOPHEN 325 MG PO TABS: 650.0000 mg | ORAL_TABLET | ORAL | Status: DC | PRN

## 2016-08-01 MED ORDER — OXYTOCIN BOLUS FROM INFUSION
500.0000 mL | Freq: Once | INTRAVENOUS | Status: AC
  Administered 2016-08-03: 500 mL via INTRAVENOUS

## 2016-08-01 MED ORDER — MISOPROSTOL 50MCG HALF TABLET
50.0000 ug | ORAL_TABLET | Freq: Once | ORAL | Status: AC
  Administered 2016-08-01: 50 ug via ORAL
  Filled 2016-08-01: qty 0.5

## 2016-08-01 MED ORDER — DIPHENHYDRAMINE HCL 50 MG/ML IJ SOLN
12.5000 mg | INTRAMUSCULAR | Status: AC | PRN
  Administered 2016-08-02 (×3): 12.5 mg via INTRAVENOUS
  Filled 2016-08-01 (×3): qty 1

## 2016-08-01 MED ORDER — OXYTOCIN 40 UNITS IN LACTATED RINGERS INFUSION - SIMPLE MED
2.5000 [IU]/h | INTRAVENOUS | Status: DC
  Filled 2016-08-01: qty 1000

## 2016-08-01 MED ORDER — PHENYLEPHRINE 40 MCG/ML (10ML) SYRINGE FOR IV PUSH (FOR BLOOD PRESSURE SUPPORT)
80.0000 ug | PREFILLED_SYRINGE | INTRAVENOUS | Status: DC | PRN
  Filled 2016-08-01: qty 5

## 2016-08-01 NOTE — Patient Instructions (Signed)
Fetal Movement Counts Patient Name: ________________________________________________ Patient Due Date: ____________________ What is a fetal movement count? A fetal movement count is the number of times that you feel your baby move during a certain amount of time. This may also be called a fetal kick count. A fetal movement count is recommended for every pregnant woman. You may be asked to start counting fetal movements as early as week 28 of your pregnancy. Pay attention to when your baby is most active. You may notice your baby's sleep and wake cycles. You may also notice things that make your baby move more. You should do a fetal movement count:  When your baby is normally most active.  At the same time each day. A good time to count movements is while you are resting, after having something to eat and drink. How do I count fetal movements? 1. Find a quiet, comfortable area. Sit, or lie down on your side. 2. Write down the date, the start time and stop time, and the number of movements that you felt between those two times. Take this information with you to your health care visits. 3. For 2 hours, count kicks, flutters, swishes, rolls, and jabs. You should feel at least 10 movements during 2 hours. 4. You may stop counting after you have felt 10 movements. 5. If you do not feel 10 movements in 2 hours, have something to eat and drink. Then, keep resting and counting for 1 hour. If you feel at least 4 movements during that hour, you may stop counting. Contact a health care provider if:  You feel fewer than 4 movements in 2 hours.  Your baby is not moving like he or she usually does. Date: ____________ Start time: ____________ Stop time: ____________ Movements: ____________ Date: ____________ Start time: ____________ Stop time: ____________ Movements: ____________ Date: ____________ Start time: ____________ Stop time: ____________ Movements: ____________ Date: ____________ Start time:  ____________ Stop time: ____________ Movements: ____________ Date: ____________ Start time: ____________ Stop time: ____________ Movements: ____________ Date: ____________ Start time: ____________ Stop time: ____________ Movements: ____________ Date: ____________ Start time: ____________ Stop time: ____________ Movements: ____________ Date: ____________ Start time: ____________ Stop time: ____________ Movements: ____________ Date: ____________ Start time: ____________ Stop time: ____________ Movements: ____________ This information is not intended to replace advice given to you by your health care provider. Make sure you discuss any questions you have with your health care provider. Document Released: 06/15/2006 Document Revised: 01/13/2016 Document Reviewed: 06/25/2015 Elsevier Interactive Patient Education  2017 [employer]. Braxton Hicks Contractions Contractions of the uterus can occur throughout pregnancy, but they are not always a sign that you are in labor. You may have practice contractions called Braxton Hicks contractions. These false labor contractions are sometimes confused with true labor. What are Braxton Hicks contractions? Braxton Hicks contractions are tightening movements that occur in the muscles of the uterus before labor. Unlike true labor contractions, these contractions do not result in opening (dilation) and thinning of the cervix. Toward the end of pregnancy (32-34 weeks), Braxton Hicks contractions can happen more often and may become stronger. These contractions are sometimes difficult to tell apart from true labor because they can be very uncomfortable. You should not feel embarrassed if you go to the hospital with false labor. Sometimes, the only way to tell if you are in true labor is for your health care provider to look for changes in the cervix. The health care provider will do a physical exam and may monitor your contractions. If you   are not in true labor, the exam  should show that your cervix is not dilating and your water has not broken. If there are no prenatal problems or other health problems associated with your pregnancy, it is completely safe for you to be sent home with false labor. You may continue to have Braxton Hicks contractions until you go into true labor. How can I tell the difference between true labor and false labor?  Differences  False labor  Contractions last 30-70 seconds.: Contractions are usually shorter and not as strong as true labor contractions.  Contractions become very regular.: Contractions are usually irregular.  Discomfort is usually felt in the top of the uterus, and it spreads to the lower abdomen and low back.: Contractions are often felt in the front of the lower abdomen and in the groin.  Contractions do not go away with walking.: Contractions may go away when you walk around or change positions while lying down.  Contractions usually become more intense and increase in frequency.: Contractions get weaker and are shorter-lasting as time goes on.  The cervix dilates and gets thinner.: The cervix usually does not dilate or become thin. Follow these instructions at home:  Take over-the-counter and prescription medicines only as told by your health care provider.  Keep up with your usual exercises and follow other instructions from your health care provider.  Eat and drink lightly if you think you are going into labor.  If Braxton Hicks contractions are making you uncomfortable:  Change your position from lying down or resting to walking, or change from walking to resting.  Sit and rest in a tub of warm water.  Drink enough fluid to keep your urine clear or pale yellow. Dehydration may cause these contractions.  Do slow and deep breathing several times an hour.  Keep all follow-up prenatal visits as told by your health care provider. This is important. Contact a health care provider if:  You have a  fever.  You have continuous pain in your abdomen. Get help right away if:  Your contractions become stronger, more regular, and closer together.  You have fluid leaking or gushing from your vagina.  You pass blood-tinged mucus (bloody show).  You have bleeding from your vagina.  You have low back pain that you never had before.  You feel your baby's head pushing down and causing pelvic pressure.  Your baby is not moving inside you as much as it used to. Summary  Contractions that occur before labor are called Braxton Hicks contractions, false labor, or practice contractions.  Braxton Hicks contractions are usually shorter, weaker, farther apart, and less regular than true labor contractions. True labor contractions usually become progressively stronger and regular and they become more frequent.  Manage discomfort from Braxton Hicks contractions by changing position, resting in a warm bath, drinking plenty of water, or practicing deep breathing. This information is not intended to replace advice given to you by your health care provider. Make sure you discuss any questions you have with your health care provider. Document Released: 05/16/2005 Document Revised: 04/04/2016 Document Reviewed: 04/04/2016 Elsevier Interactive Patient Education  2017 Elsevier Inc.  

## 2016-08-01 NOTE — MAU Note (Signed)
Patient sent up from clinic for evaluation of elevated blood pressure. Denies headaches, epigastric pain. Does endorse slight increase in swelling in her feet and occasional floaters. Denies LOF, VB, or contractions at this time. +FM.

## 2016-08-01 NOTE — MAU Provider Note (Signed)
  History   Please refer to H&P - MAU note started before realized would need admission.  Do not bill for this.  CSN: [id number]  Arrival date and time: 08/01/16 1530   First Provider Initiated Contact with Patient 08/01/16 1553      Chief Complaint  Patient presents with  . elevated blood pressure   HPI   Past Medical History:  Diagnosis Date  . Complication of anesthesia    difficulty breathing after surgery     Past Surgical History:  Procedure Laterality Date  . CHOLECYSTECTOMY    . PILONIDAL CYST EXCISION      Family History  Problem Relation Age of Onset  . Cancer Other   . Diabetes Other   . Hypertension Father     Social History  Substance Use Topics  . Smoking status: Former Games developermoker  . Smokeless tobacco: Never Used  . Alcohol use No    Allergies: No Known Allergies  Prescriptions Prior to Admission  Medication Sig Dispense Refill Last Dose  . iron polysaccharides (NIFEREX) 150 MG capsule Take 1 capsule (150 mg total) by mouth daily. 30 capsule 3 Taking  . Prenatal MV-Min-FA-Omega-3 (PRENATAL GUMMIES/DHA & FA) 0.4-32.5 MG CHEW Chew 1 tablet by mouth daily.   Taking    Review of Systems Physical Exam   Blood pressure 147/83, pulse 96, resp. rate 18, height 5\' 7"  (1.702 m), weight 247 lb 1.3 oz (112.1 kg), last menstrual period 11/08/2015, SpO2 100 %.  Physical Exam  MAU Course  Procedures  MDM Please refer to H&P.  Assessment and Plan  Please refer to H&P.  [name] MerlesJulia Rhoden 08/01/2016, 3:53 PM

## 2016-08-01 NOTE — Progress Notes (Signed)
Denies headaches but states sometimes sees white spots- about every other day.

## 2016-08-01 NOTE — Progress Notes (Signed)
Report called to MAU RN: Ginger and patient taken to mau for preeclampsia workup.

## 2016-08-01 NOTE — Anesthesia Pain Management Evaluation Note (Signed)
  CRNA Pain Management Visit Note  Patient: [name] [name], 36 y.o., female  "Hello I am a member of the anesthesia team at [facility]. We have an anesthesia team available at all times to provide care throughout the hospital, including epidural management and anesthesia for C-section. I don't know your plan for the delivery whether it a natural birth, water birth, IV sedation, nitrous supplementation, doula or epidural, but we want to meet your pain goals."   1.Was your pain managed to your expectations on prior hospitalizations?   Yes   2.What is your expectation for pain management during this hospitalization?     Epidural  3.How can we help you reach that goal? Wants epidural as soon as possible. Told RN patient wants epidural as soon as possible. RN said she would let doctor know and see if she can get it.  Record the patient's initial score and the patient's pain goal.   Pain: 7  Pain Goal: 5 [facility] wants you to be able to say your pain was always managed very well.  Joanthony Hamza 08/01/2016

## 2016-08-01 NOTE — Progress Notes (Signed)
[name] [name] is a 36 y.o. G3P1011 at 4225w1d  Subjective: Doing well no complaints.  Objective: BP 140/79   Pulse 88   Temp 98.2 F (36.8 C) (Oral)   Resp 18   Ht 5\' 7"  (1.702 m)   Wt 247 lb (112 kg)   LMP 11/08/2015   SpO2 100%   BMI 38.69 kg/m  No intake/output data recorded. No intake/output data recorded.  FHT:  FHR: 140 bpm, variability: moderate,  accelerations:  Present,  decelerations:  Absent UC:  irregular SVE:   Dilation: 1 Effacement (%): 50 Station: -3 Exam by:: Dr. Genevie AnnSchenk  Labs: Lab Results  Component Value Date   WBC 9.0 08/01/2016   HGB 8.8 (L) 08/01/2016   HCT 27.8 (L) 08/01/2016   MCV 77.2 (L) 08/01/2016   PLT 266 08/01/2016    Assessment / Plan: Spontaneous labor, progressing normally  Labor: Progressing normally Preeclampsia:  None Fetal Wellbeing:  Category I Pain Control:  IV pain meds I/D:  n/a Anticipated MOD:  NSVD  Wendee Beaversavid J Hildred Pharo, DO, PGY-1 08/01/2016, 9:40 PM

## 2016-08-01 NOTE — Anesthesia Preprocedure Evaluation (Signed)
Anesthesia Evaluation  Patient identified by MRN, date of birth, ID band Patient awake    Reviewed: Allergy & Precautions, H&P , Patient's Chart, lab work & pertinent test results  Airway Mallampati: II  TM Distance: >3 FB Neck ROM: full    Dental no notable dental hx.    Pulmonary former smoker,    Pulmonary exam normal        Cardiovascular hypertension, Normal cardiovascular exam     Neuro/Psych negative neurological ROS  negative psych ROS   GI/Hepatic negative GI ROS, Neg liver ROS,   Endo/Other  Morbid obesity  Renal/GU negative Renal ROS     Musculoskeletal negative musculoskeletal ROS (+)   Abdominal (+) + obese,   Peds  Hematology negative hematology ROS (+)   Anesthesia Other Findings   Reproductive/Obstetrics (+) Pregnancy                             Anesthesia Physical Anesthesia Plan  ASA: III  Anesthesia Plan: Epidural   Post-op Pain Management:    Induction:   Airway Management Planned:   Additional Equipment:   Intra-op Plan:   Post-operative Plan:   Informed Consent: I have reviewed the patients History and Physical, chart, labs and discussed the procedure including the risks, benefits and alternatives for the proposed anesthesia with the patient or authorized representative who has indicated his/her understanding and acceptance.     Plan Discussed with:   Anesthesia Plan Comments:         Anesthesia Quick Evaluation

## 2016-08-01 NOTE — H&P (Signed)
LABOR AND DELIVERY ADMISSION HISTORY AND PHYSICAL NOTE  ARNECIA [name] is a 36 y.o. female G3P1011 with IUP at [redacted]w[redacted]d by 2nd trimester [place] presenting for IOL for gestational hypertension.  She was seen earlier in clinic today where her blood pressures were noted to be high for the first time this pregnancy.  She does not have a diagnosis of chronic HTN.  She was sent to the MAU for evaluation where her pressures remained elevated.  She has noticed scotomata for the past few weeks that come and go, none today.  She is otherwise asymptomatic.  She is not having contractions.   She reports positive fetal movement. She denies leakage of fluid or vaginal bleeding.  Prenatal History/Complications:  Past Medical History: Past Medical History:  Diagnosis Date  . Complication of anesthesia    difficulty breathing after surgery     Past Surgical History: Past Surgical History:  Procedure Laterality Date  . CHOLECYSTECTOMY    . PILONIDAL CYST EXCISION      Obstetrical History: OB History    Gravida Para Term Preterm AB Living   3 1 1  0 1 1   SAB TAB Ectopic Multiple Live Births   0 1 0 0 1      Social History: Social History   Social History  . Marital status: Single    Spouse name: N/A  . Number of children: N/A  . Years of education: N/A   Social History Main Topics  . Smoking status: Former Games developer  . Smokeless tobacco: Never Used  . Alcohol use No  . Drug use: No  . Sexual activity: Yes    Birth control/ protection: None   Other Topics Concern  . None   Social History Narrative  . Feels safe at home    Family History: Family History  Problem Relation Age of Onset  . Cancer Other   . Diabetes Other   . Hypertension Father     Allergies: No Known Allergies  Prescriptions Prior to Admission  Medication Sig Dispense Refill Last Dose  . acetaminophen (TYLENOL) 325 MG tablet Take 325 mg by mouth every 6 (six) hours as needed for moderate pain.   Past Week at Unknown  time  . Prenatal MV-Min-FA-Omega-3 (PRENATAL GUMMIES/DHA & FA) 0.4-32.5 MG CHEW Chew 1 tablet by mouth daily.   08/01/2016 at Unknown time  . iron polysaccharides (NIFEREX) 150 MG capsule Take 1 capsule (150 mg total) by mouth daily. (Patient not taking: Reported on 08/01/2016) 30 capsule 3 Not Taking at Unknown time    Review of Systems   Review of Systems  Constitutional: Negative for fever.  HENT: Negative for congestion and sore throat.   Eyes: Negative for blurred vision.  Respiratory: Negative for shortness of breath.   Cardiovascular: Negative for chest pain.  Gastrointestinal: Negative for abdominal pain.  Genitourinary: Negative for dysuria.  Neurological: Negative for headaches.    Blood pressure 140/84, pulse 100, temperature 98.1 F (36.7 C), temperature source Oral, resp. rate 18, height 5\' 7"  (1.702 m), weight 247 lb 1.3 oz (112.1 kg), last menstrual period 11/08/2015, SpO2 100 %.   Physical Exam  Constitutional: She is oriented to person, place, and time and well-developed, well-nourished, and in no distress.  HENT:  Head: Normocephalic and atraumatic.  Right Ear: External ear normal.  Left Ear: External ear normal.  Mouth/Throat: Oropharynx is clear and moist.  Eyes: Conjunctivae and EOM are normal.  Neck: Normal range of motion. Neck supple.  Cardiovascular: Normal  rate and regular rhythm.   Murmur heard. 2/6 systolic ejection murmur  Pulmonary/Chest: Effort normal. She has no wheezes. She has no rales.  Abdominal: Soft. Bowel sounds are normal. There is no tenderness.  gravid  Musculoskeletal: She exhibits no edema.  Neurological: She is alert and oriented to person, place, and time.  Skin: Skin is warm and dry.  Psychiatric: Affect and judgment normal.   Presentation: cephalic Fetal monitoring: 145/+accels/no decels/mod var - cat I Uterine activity: irritability    Prenatal labs: ABO, Rh: O/POS/-- (12/21 0931) Antibody: NEG (12/21 0931) Rubella:  immune RPR: NON REAC (12/21 0931)  HBsAg: NEGATIVE (12/21 0931)  HIV: NONREACTIVE (12/21 0931)  GBS: Negative (02/22 0000)  2 hr Glucola: 63/106/106 (wnl) Genetic screening:  Too late to prenatal care Anatomy US: wnl  Prenatal Transfer Tool  Maternal Diabetes: No Genetic Screening: too late to prenatal care Fetal Ultrasounds or other Referrals: 3/1 for s>d, EFW 3331 (79th percentile) Maternal Substance Abuse:  No Significant Maternal Medications:  None Significant Maternal Lab Results: None  Results for orders placed or performed during the hospital encounter of 08/01/16 (from the past 24 hour(s))  Protein / creatinine ratio, urine   Collection Time: 08/01/16  3:36 PM  Result Value Ref Range   Creatinine, Urine 165.00 mg/dL   Total Protein, Urine 30 mg/dL   Protein Creatinine Ratio 0.18 (H) 0.00 - 0.15 mg/mg[Cre]  CBC   Collection Time: 08/01/16  3:52 PM  Result Value Ref Range   WBC 9.0 4.0 - 10.5 K/uL   RBC 3.60 (L) 3.87 - 5.11 MIL/uL   Hemoglobin 8.8 (L) 12.0 - 15.0 g/dL   HCT 08.627.8 (L) 57.836.0 - 46.946.0 %   MCV 77.2 (L) 78.0 - 100.0 fL   MCH 24.4 (L) 26.0 - 34.0 pg   MCHC 31.7 30.0 - 36.0 g/dL   RDW 62.913.9 52.811.5 - 41.315.5 %   Platelets 266 150 - 400 K/uL  Comprehensive metabolic panel   Collection Time: 08/01/16  3:52 PM  Result Value Ref Range   Sodium 133 (L) 135 - 145 mmol/L   Potassium 3.2 (L) 3.5 - 5.1 mmol/L   Chloride 106 101 - 111 mmol/L   CO2 22 22 - 32 mmol/L   Glucose, Bld 86 65 - 99 mg/dL   BUN 5 (L) 6 - 20 mg/dL   Creatinine, Ser 2.440.49 0.44 - 1.00 mg/dL   Calcium 8.5 (L) 8.9 - 10.3 mg/dL   Total Protein 6.4 (L) 6.5 - 8.1 g/dL   Albumin 2.8 (L) 3.5 - 5.0 g/dL   AST 21 15 - 41 U/L   ALT 15 14 - 54 U/L   Alkaline Phosphatase 148 (H) 38 - 126 U/L   Total Bilirubin 0.1 (L) 0.3 - 1.2 mg/dL   GFR calc non Af Amer >60 >60 mL/min   GFR calc Af Amer >60 >60 mL/min   Anion gap 5 5 - 15    Patient Active Problem List   Diagnosis Date Noted  . Gestational  hypertension 08/01/2016  . Uterine size date discrepancy pregnancy, third trimester 07/28/2016  . Hemorrhoids during pregnancy, antepartum 06/27/2016  . Trichomonal vaginitis during pregnancy in third trimester 06/02/2016  . Supervision of low-risk pregnancy 05/19/2016  . Late prenatal care affecting pregnancy, antepartum 05/19/2016  . Obesity affecting pregnancy, antepartum 05/19/2016    Assessment: Trellis PaganiniStarr S Saar is a 36 y.o. G3P1011 at 9058w1d admitted for IOL for gestational hypertension.  #labor: induce with cytotec, foley bulb/pitocin as able #Pain: Desires  CLE #FWB: Category I tracing #ID:  none #MOF: bottle #MOC: undecided  Routine intrapartum care.  [name] 08/01/2016, 5:16 PM   OB FELLOW HISTORY AND PHYSICAL ATTESTATION  I have seen and examined this patient; I agree with above documentation in the resident's note.    Ms Purington is a 36 y/o G3P1 at 20 and 1 admitted for IOL for gHTN. She had elevated BP in the office and was sent to MAU for evaluation. She had BP to 140/90's there. No symptoms of pre-eclampsia. PIH labs all normal  Gen: well appearing NAD Pulm: no respiratory distress, no audible wheezing CV: regular rate, distal pulses intact.  Abd: soft NT gravid Ext: +1 swelling B/l  A/P patient with gestational hypertension admit for IOL with cytotec.  Ernestina Penna 08/01/2016, 7:20 PM

## 2016-08-01 NOTE — Progress Notes (Signed)
   PRENATAL VISIT NOTE  Subjective:  [name] [name] is a 36 y.o. G3P1011 at 3484w1d being seen today for ongoing prenatal care.  She is currently monitored for the following issues for this low-risk pregnancy and has Supervision of low-risk pregnancy; Late prenatal care affecting pregnancy, antepartum; Obesity affecting pregnancy, antepartum; Trichomonal vaginitis during pregnancy in third trimester; Hemorrhoids during pregnancy, antepartum; and Uterine size date discrepancy pregnancy, third trimester on her problem list.  Patient reports no complaints.  Contractions: Irregular. Vag. Bleeding: None.  Movement: Present. Denies leaking of fluid.   The following portions of the patient's history were reviewed and updated as appropriate: allergies, current medications, past family history, past medical history, past social history, past surgical history and problem list. Problem list updated.  Objective:   Vitals:   08/01/16 1504 08/01/16 1507  BP: (!) 147/79 (!) 148/68  Pulse: (!) 103 (!) 103  Weight: 246 lb 8 oz (111.8 kg)     Fetal Status: Fetal Heart Rate (bpm): 140 Fundal Height: 41 cm Movement: Present     General:  Alert, oriented and cooperative. Patient is in no acute distress.  Skin: Skin is warm and dry. No rash noted.   Cardiovascular: Normal heart rate noted  Respiratory: Normal respiratory effort, no problems with respiration noted  Abdomen: Soft, gravid, appropriate for gestational age. Pain/Pressure: Present     Pelvic:  Cervical exam deferred        Extremities: Normal range of motion.  Edema: Trace  Mental Status: Normal mood and affect. Normal behavior. Normal judgment and thought content.   Assessment and Plan:  Pregnancy: G3P1011 at 35 [redacted]w[redacted]d  1. Encounter for supervision of low-risk pregnancy in third trimester - patient concerned about continuing to work up until delivery, advised that she discuss with MD at next visit if she is not induced today after MAU visit for  Pre-eclampsia work-up  2. Elevated blood pressure affecting pregnancy in third trimester, antepartum - Sent to MAU for STAT labs, serial BPs and NST  3. Uterine size/date discrepancy  - Reviewed US for growth ordered after last visit. EFW in 79th %tile.   Term labor symptoms and general obstetric precautions including but not limited to vaginal bleeding, contractions, leaking of fluid and fetal movement were reviewed in detail with the patient. Please refer to After Visit Summary for other counseling recommendations.  Return in about 1 week (around 08/08/2016) for LOB with MD. If discharge from MAU today.    Marny LowensteinJulie N Wenzel, PA-C

## 2016-08-02 ENCOUNTER — Inpatient Hospital Stay (HOSPITAL_COMMUNITY): Payer: BLUE CROSS/BLUE SHIELD | Admitting: Anesthesiology

## 2016-08-02 ENCOUNTER — Encounter (HOSPITAL_COMMUNITY): Payer: Self-pay

## 2016-08-02 LAB — RPR: RPR Ser Ql: NONREACTIVE

## 2016-08-02 MED ORDER — OXYTOCIN 40 UNITS IN LACTATED RINGERS INFUSION - SIMPLE MED
1.0000 m[IU]/min | INTRAVENOUS | Status: DC
  Administered 2016-08-03: 1 m[IU]/min via INTRAVENOUS
  Filled 2016-08-02: qty 1000

## 2016-08-02 MED ORDER — DIPHENHYDRAMINE HCL 25 MG PO CAPS: 25.0000 mg | ORAL_CAPSULE | Freq: Four times a day (QID) | ORAL | Status: DC | PRN

## 2016-08-02 MED ORDER — MISOPROSTOL 50MCG HALF TABLET: 50.0000 ug | ORAL_TABLET | Freq: Once | ORAL | Status: DC

## 2016-08-02 MED ORDER — DIPHENHYDRAMINE HCL 25 MG PO CAPS
50.0000 mg | ORAL_CAPSULE | Freq: Four times a day (QID) | ORAL | Status: DC | PRN
  Administered 2016-08-02: 50 mg via ORAL
  Filled 2016-08-02: qty 2

## 2016-08-02 MED ORDER — MISOPROSTOL 25 MCG QUARTER TABLET: 25.0000 ug | ORAL_TABLET | ORAL | Status: DC

## 2016-08-02 MED ORDER — ZOLPIDEM TARTRATE 5 MG PO TABS
5.0000 mg | ORAL_TABLET | Freq: Once | ORAL | Status: AC
  Administered 2016-08-02: 5 mg via ORAL
  Filled 2016-08-02: qty 1

## 2016-08-02 MED ORDER — LIDOCAINE HCL (PF) 1 % IJ SOLN
INTRAMUSCULAR | Status: DC | PRN
  Administered 2016-08-02: 7 mL via EPIDURAL
  Administered 2016-08-02: 6 mL via EPIDURAL

## 2016-08-02 MED ORDER — MISOPROSTOL 200 MCG PO TABS: 50.0000 ug | ORAL_TABLET | Freq: Once | ORAL | Status: DC

## 2016-08-02 NOTE — Progress Notes (Signed)
LABOR PROGRESS NOTE  [name] [name] is a 36 y.o. G3P1011 at 9323w2d  admitted for IOL for gHTN  Subjective: Epidural overnight, still feeling uncomfortable this morning 6/10 vaginal pain.  Objective: BP (!) 125/94 (BP Location: Left Arm)   Pulse 81   Temp 98.3 F (36.8 C) (Oral)   Resp 16   Ht 5\' 7"  (1.702 m)   Wt 247 lb (112 kg)   LMP 11/08/2015   SpO2 100%   BMI 38.69 kg/m  or   Dilation: Fingertip Effacement (%): Thick Cervical Position: Posterior Station: -3 Presentation: Vertex Exam by:: MD Wouk  Labs: Lab Results  Component Value Date   WBC 9.0 08/01/2016   HGB 8.8 (L) 08/01/2016   HCT 27.8 (L) 08/01/2016   MCV 77.2 (L) 08/01/2016   PLT 266 08/01/2016    Assessment / Plan: 36 y.o. G3P1011 at 7423w2d here for IOL for gHTN  Labor: induction initiated with cytotec, switched to pitocin overnight.  Exam essentially unchanged.  Successful placement of Foley bulb at approximately 9AM.  Switch back to cytotec. Fetal Wellbeing:  Category I - 140/+accels/no decels/m Pain Control:  epidural Anticipated MOD:  Vaginal gHTN:  Labs without e/o preE, pressures intermittently elvated, all <160/<100  [name] MerlesJulia Rhoden, MD 08/02/2016, 9:12 AM

## 2016-08-02 NOTE — Progress Notes (Signed)
Dr. Macon LargeAnyanwu at bedside to evaluate patient. Will begin low-dose pitocin for cervical ripening; Foley still in place.

## 2016-08-02 NOTE — Progress Notes (Signed)
[name] [name] is a 36 y.o. G3P1011 at 3410w2d  Subjective: Pain well controlled. Says she cannot feel contractions. No new complaints.  Objective: BP 133/72   Pulse 68   Temp 98 F (36.7 C) (Oral)   Resp 18   Ht 5\' 7"  (1.702 m)   Wt 247 lb (112 kg)   LMP 11/08/2015   SpO2 100%   BMI 38.69 kg/m  No intake/output data recorded. No intake/output data recorded.  FHT:  FHR: 135 bpm, variability: minimal ,  accelerations:  Present,  decelerations:  Absent UC:   irregular, every 3-4 minutes SVE:   Dilation: 1 Effacement (%): 70 Station: -3 Exam by:: Melburn PopperMichelle Williams, RN  Labs: Lab Results  Component Value Date   WBC 9.0 08/01/2016   HGB 8.8 (L) 08/01/2016   HCT 27.8 (L) 08/01/2016   MCV 77.2 (L) 08/01/2016   PLT 266 08/01/2016    Assessment / Plan: Induction of labor due to gestational hypertension,  progressing well on pitocin  Labor: Progressing normally Preeclampsia:  None Fetal Wellbeing:  Category I Pain Control:  Epidural I/D:  n/a Anticipated MOD:  NSVD  Wendee Beaversavid J Laine Fonner, DO, PGY-1 08/02/2016, 4:33 AM

## 2016-08-02 NOTE — Progress Notes (Signed)
LABOR PROGRESS NOTE  Trellis PaganiniStarr S Vanderzanden is a 36 y.o. G3P1011 at 5730w2d  Subjective: Foley bulb in since this morning.  Given benadryl earlier for itching/to rest.  Objective: BP 103/75 (BP Location: Left Arm)   Pulse 85   Temp 98.3 F (36.8 C) (Oral)   Resp 16   Ht 5\' 7"  (1.702 m)   Wt 247 lb (112 kg)   LMP 11/08/2015   SpO2 100%   BMI 38.69 kg/m    Dilation: Fingertip Effacement (%): Thick Cervical Position: Posterior Station: -3 Presentation: Vertex Exam by:: MD Rhoden  Assessment / Plan: 36 y.o. G3P1011 at 1430w2d here for IOL for gestational hypertension.  Labor: IOL, Foley bulb still in place since 9AM.  Continue with cytotec. Fetal Wellbeing:  Category II - periods of minimal variability.  May be related to recent benadryl + sleep cycles. Pain Control:  epidural Anticipated MOD:  Vaginal gHTN:  Labs without e/o preE, pressures intermittently elvated, all <160/<100  Charlsie MerlesJulia Rhoden, MD 08/02/2016, 4:51 PM

## 2016-08-02 NOTE — Progress Notes (Signed)
[name] [name] is a 36 y.o. G3P1011 at 9666w2d   Subjective: Patient says back pain is persistent. Epidural has helped with pain. Wanting to sleep after receiving Ambien.  Objective: BP (!) 88/70   Pulse 79   Temp 97.7 F (36.5 C) (Oral)   Resp 16   Ht 5\' 7"  (1.702 m)   Wt 247 lb (112 kg)   LMP 11/08/2015   SpO2 100%   BMI 38.69 kg/m  I/O last 3 completed shifts: In: -  Out: 1200 [Urine:1200] No intake/output data recorded.  FHT:  FHR: 140 bpm, variability: minimal ,  accelerations:  Present,  decelerations:  Absent UC:   irregular, every 5-8 minutes SVE:   Dilation: Fingertip Effacement (%): Thick Station: -3 Exam by:: MD Rhogan  Labs: Lab Results  Component Value Date   WBC 9.0 08/01/2016   HGB 8.8 (L) 08/01/2016   HCT 27.8 (L) 08/01/2016   MCV 77.2 (L) 08/01/2016   PLT 266 08/01/2016    Assessment / Plan: Induction of labor due to gestational hypertension,  progressing well on pitocin  Labor: Progressing normally Preeclampsia:  None Fetal Wellbeing:  Category I Pain Control:  Epidural I/D:  GBS negative Anticipated MOD:  NSVD  Wendee Beaversavid J Adriona Kaney, DO, PGY-1 08/02/2016, 8:57 PM

## 2016-08-02 NOTE — Anesthesia Procedure Notes (Signed)
Epidural Patient location during procedure: OB Start time: 08/02/2016 12:03 AM End time: 08/02/2016 12:05 AM  Staffing Anesthesiologist: Leilani AbleHATCHETT, Hendryx Ricke Performed: anesthesiologist   Preanesthetic Checklist Completed: patient identified, surgical consent, pre-op evaluation, timeout performed, IV checked, risks and benefits discussed and monitors and equipment checked  Epidural Patient position: sitting Prep: site prepped and draped and DuraPrep Patient monitoring: continuous pulse ox and blood pressure Approach: midline Location: L3-L4 Injection technique: LOR air  Needle:  Needle type: Tuohy  Needle gauge: 17 G Needle length: 9 cm and 9 Needle insertion depth: 6 cm Catheter type: closed end flexible Catheter size: 19 Gauge Catheter at skin depth: 11 cm Test dose: negative and Other  Assessment Sensory level: T9 Events: blood not aspirated, injection not painful, no injection resistance, negative IV test and no paresthesia  Additional Notes Reason for block:procedure for pain

## 2016-08-02 NOTE — Progress Notes (Signed)
Received report from [place] Prairie[place], RN at [phone].

## 2016-08-02 NOTE — Progress Notes (Signed)
   [name] [name] is a 36 y.o. G3P1011 at 3867w2d  admitted for gestational hypertension  Subjective:  Patient resting comfortably.  Objective: Vitals:   08/02/16 1931 08/02/16 2001 08/02/16 2032 08/02/16 2102  BP: (!) 149/73 (!) 135/110 (!) 88/70 (!) 145/89  Pulse: 85 69 79 (!) 102  Resp: 16 16 16 16   Temp: 97.7 F (36.5 C)     TempSrc: Oral     SpO2:      Weight:      Height:       No intake/output data recorded.  FHT:  FHR: 145 bpm, variability: moderate,  accelerations:  Abscent,  decelerations:  Absent UC:   irregular, 5-8 minutes SVE:   Dilation: Fingertip Effacement (%): Thick Station: -3 Exam by:: MD Rhogan Pitocin @ 0 mu/min  Labs: Lab Results  Component Value Date   WBC 9.0 08/01/2016   HGB 8.8 (L) 08/01/2016   HCT 27.8 (L) 08/01/2016   MCV 77.2 (L) 08/01/2016   PLT 266 08/01/2016    Assessment / Plan: Patient in latent labor; plan to continue oral cytotec at 50 mg once patient has reactive tracing.   Labor: latent phase Fetal Wellbeing:  Category II; provider monitoring the FHR and available for immediate intervention if necessary Pain Control:  Epidural Anticipated MOD:  NSVD  [name] GaribaldiKathryn Lorraine [name] CNM 08/02/2016, 9:36 PM

## 2016-08-03 ENCOUNTER — Encounter (HOSPITAL_COMMUNITY): Payer: Self-pay | Admitting: General Practice

## 2016-08-03 LAB — COMPREHENSIVE METABOLIC PANEL WITH GFR
ALT: 18 U/L (ref 14–54)
AST: 30 U/L (ref 15–41)
Albumin: 2.6 g/dL — ABNORMAL LOW (ref 3.5–5.0)
Alkaline Phosphatase: 185 U/L — ABNORMAL HIGH (ref 38–126)
Anion gap: 10 (ref 5–15)
BUN: 6 mg/dL (ref 6–20)
CO2: 19 mmol/L — ABNORMAL LOW (ref 22–32)
Calcium: 8 mg/dL — ABNORMAL LOW (ref 8.9–10.3)
Chloride: 102 mmol/L (ref 101–111)
Creatinine, Ser: 1.14 mg/dL — ABNORMAL HIGH (ref 0.44–1.00)
GFR calc Af Amer: >60 mL/min
GFR calc non Af Amer: >60 mL/min
Glucose, Bld: 122 mg/dL — ABNORMAL HIGH (ref 65–99)
Potassium: 3.2 mmol/L — ABNORMAL LOW (ref 3.5–5.1)
Sodium: 131 mmol/L — ABNORMAL LOW (ref 135–145)
Total Bilirubin: 0.9 mg/dL (ref 0.3–1.2)
Total Protein: 6.4 g/dL — ABNORMAL LOW (ref 6.5–8.1)

## 2016-08-03 LAB — COMPREHENSIVE METABOLIC PANEL
ALK PHOS: 170 U/L — AB (ref 38–126)
ALT: 17 U/L (ref 14–54)
AST: 26 U/L (ref 15–41)
Albumin: 2.7 g/dL — ABNORMAL LOW (ref 3.5–5.0)
Anion gap: 8 (ref 5–15)
BUN: 5 mg/dL — AB (ref 6–20)
CALCIUM: 8.6 mg/dL — AB (ref 8.9–10.3)
CHLORIDE: 103 mmol/L (ref 101–111)
CO2: 21 mmol/L — AB (ref 22–32)
CREATININE: 0.73 mg/dL (ref 0.44–1.00)
GFR calc non Af Amer: >60 mL/min (ref >60)
GLUCOSE: 83 mg/dL (ref 65–99)
Potassium: 3.1 mmol/L — ABNORMAL LOW (ref 3.5–5.1)
SODIUM: 132 mmol/L — AB (ref 135–145)
Total Bilirubin: 0.5 mg/dL (ref 0.3–1.2)
Total Protein: 6.4 g/dL — ABNORMAL LOW (ref 6.5–8.1)

## 2016-08-03 LAB — CBC WITH DIFFERENTIAL/PLATELET
BASOS ABS: 0 10*3/uL (ref 0.0–0.1)
Basophils Relative: 0 %
EOS ABS: 0 10*3/uL (ref 0.0–0.7)
Eosinophils Relative: 0 %
HCT: 28.3 % — ABNORMAL LOW (ref 36.0–46.0)
HEMOGLOBIN: 9.2 g/dL — AB (ref 12.0–15.0)
LYMPHS ABS: 1.3 10*3/uL (ref 0.7–4.0)
LYMPHS PCT: 9 %
MCH: 24.9 pg — AB (ref 26.0–34.0)
MCHC: 32.5 g/dL (ref 30.0–36.0)
MCV: 76.7 fL — ABNORMAL LOW (ref 78.0–100.0)
Monocytes Absolute: 0.5 10*3/uL (ref 0.1–1.0)
Monocytes Relative: 4 %
NEUTROS PCT: 87 %
Neutro Abs: 11.9 10*3/uL — ABNORMAL HIGH (ref 1.7–7.7)
PLATELETS: 264 10*3/uL (ref 150–400)
RBC: 3.69 MIL/uL — AB (ref 3.87–5.11)
RDW: 14.1 % (ref 11.5–15.5)
WBC: 13.7 10*3/uL — AB (ref 4.0–10.5)

## 2016-08-03 LAB — CBC
HCT: 30.9 % — ABNORMAL LOW (ref 36.0–46.0)
Hemoglobin: 9.8 g/dL — ABNORMAL LOW (ref 12.0–15.0)
MCH: 24 pg — AB (ref 26.0–34.0)
MCHC: 31.7 g/dL (ref 30.0–36.0)
MCV: 75.7 fL — ABNORMAL LOW (ref 78.0–100.0)
Platelets: 269 10*3/uL (ref 150–400)
RBC: 4.08 MIL/uL (ref 3.87–5.11)
RDW: 14.3 % (ref 11.5–15.5)
WBC: 20.8 10*3/uL — ABNORMAL HIGH (ref 4.0–10.5)

## 2016-08-03 LAB — PROTEIN / CREATININE RATIO, URINE
CREATININE, URINE: 41 mg/dL
PROTEIN CREATININE RATIO: 0.24 mg/mg{creat} — AB (ref 0.00–0.15)
Total Protein, Urine: 10 mg/dL

## 2016-08-03 MED ORDER — BENZOCAINE-MENTHOL 20-0.5 % EX AERO
1.0000 "application " | INHALATION_SPRAY | CUTANEOUS | Status: DC | PRN
  Filled 2016-08-03: qty 56

## 2016-08-03 MED ORDER — SIMETHICONE 80 MG PO CHEW: 80.0000 mg | CHEWABLE_TABLET | ORAL | Status: DC | PRN

## 2016-08-03 MED ORDER — SENNOSIDES-DOCUSATE SODIUM 8.6-50 MG PO TABS
2.0000 | ORAL_TABLET | ORAL | Status: DC
  Administered 2016-08-03: 2 via ORAL
  Filled 2016-08-03 (×2): qty 2

## 2016-08-03 MED ORDER — DIPHENHYDRAMINE HCL 50 MG/ML IJ SOLN
INTRAMUSCULAR | Status: AC
Start: 2016-08-03 — End: 2016-08-03
  Administered 2016-08-03: 50 mg via INTRAVENOUS
  Filled 2016-08-03: qty 1

## 2016-08-03 MED ORDER — COCONUT OIL OIL: 1.0000 "application " | TOPICAL_OIL | Status: DC | PRN

## 2016-08-03 MED ORDER — LACTATED RINGERS IV SOLN
2.0000 g/h | INTRAVENOUS | Status: DC
  Filled 2016-08-03: qty 80

## 2016-08-03 MED ORDER — WITCH HAZEL-GLYCERIN EX PADS: 1.0000 "application " | MEDICATED_PAD | CUTANEOUS | Status: DC | PRN

## 2016-08-03 MED ORDER — TETANUS-DIPHTH-ACELL PERTUSSIS 5-2.5-18.5 LF-MCG/0.5 IM SUSP: 0.5000 mL | Freq: Once | INTRAMUSCULAR | Status: DC

## 2016-08-03 MED ORDER — MISOPROSTOL 200 MCG PO TABS
ORAL_TABLET | ORAL | Status: AC
  Filled 2016-08-03: qty 5

## 2016-08-03 MED ORDER — ONDANSETRON HCL 4 MG PO TABS: 4.0000 mg | ORAL_TABLET | ORAL | Status: DC | PRN

## 2016-08-03 MED ORDER — LABETALOL HCL 5 MG/ML IV SOLN
20.0000 mg | INTRAVENOUS | Status: DC | PRN
  Administered 2016-08-03: 20 mg via INTRAVENOUS
  Filled 2016-08-03: qty 4

## 2016-08-03 MED ORDER — HYDRALAZINE HCL 20 MG/ML IJ SOLN
10.0000 mg | Freq: Once | INTRAMUSCULAR | Status: AC | PRN
  Administered 2016-08-03: 10 mg via INTRAVENOUS
  Filled 2016-08-03: qty 1

## 2016-08-03 MED ORDER — DIBUCAINE 1 % RE OINT: 1.0000 "application " | TOPICAL_OINTMENT | RECTAL | Status: DC | PRN

## 2016-08-03 MED ORDER — DIPHENHYDRAMINE HCL 25 MG PO CAPS: 25.0000 mg | ORAL_CAPSULE | Freq: Four times a day (QID) | ORAL | Status: DC | PRN

## 2016-08-03 MED ORDER — MISOPROSTOL 200 MCG PO TABS
800.0000 ug | ORAL_TABLET | Freq: Once | ORAL | Status: AC
  Administered 2016-08-03: 800 ug via ORAL
  Filled 2016-08-03: qty 4

## 2016-08-03 MED ORDER — PANTOPRAZOLE SODIUM 40 MG IV SOLR
40.0000 mg | Freq: Once | INTRAVENOUS | Status: AC
  Administered 2016-08-03: 40 mg via INTRAVENOUS
  Filled 2016-08-03: qty 40

## 2016-08-03 MED ORDER — ZOLPIDEM TARTRATE 5 MG PO TABS: 5.0000 mg | ORAL_TABLET | Freq: Every evening | ORAL | Status: DC | PRN

## 2016-08-03 MED ORDER — ACETAMINOPHEN 325 MG PO TABS: 650.0000 mg | ORAL_TABLET | ORAL | Status: DC | PRN

## 2016-08-03 MED ORDER — MAGNESIUM SULFATE BOLUS VIA INFUSION
4.0000 g | Freq: Once | INTRAVENOUS | Status: AC
  Administered 2016-08-03: 4 g via INTRAVENOUS
  Filled 2016-08-03: qty 500

## 2016-08-03 MED ORDER — ONDANSETRON HCL 4 MG/2ML IJ SOLN: 4.0000 mg | INTRAMUSCULAR | Status: DC | PRN

## 2016-08-03 MED ORDER — LACTATED RINGERS IV SOLN
2.0000 g/h | INTRAVENOUS | Status: AC
  Administered 2016-08-04: 2 g/h via INTRAVENOUS
  Filled 2016-08-03 (×2): qty 80

## 2016-08-03 MED ORDER — IBUPROFEN 600 MG PO TABS
600.0000 mg | ORAL_TABLET | Freq: Four times a day (QID) | ORAL | Status: DC
  Administered 2016-08-03 – 2016-08-05 (×7): 600 mg via ORAL
  Filled 2016-08-03 (×7): qty 1

## 2016-08-03 MED ORDER — PRENATAL MULTIVITAMIN CH
1.0000 | ORAL_TABLET | Freq: Every day | ORAL | Status: DC
  Administered 2016-08-04 – 2016-08-05 (×2): 1 via ORAL
  Filled 2016-08-03 (×2): qty 1

## 2016-08-03 NOTE — Progress Notes (Signed)
S:  Patient seen around 11:45.  Had been comfortable since FB out this morning. AROM and IUPC placed.  Shortly after placement patient developed acute shortness of breath and central chest pain that radiated to her back.  She was placed on mask and given 50 mg of IV benadryl.  While in pain she could not speak.  A few minutes after onset, her pain was resolved to 0/10.   O: Vitals:   08/03/16 1125 08/03/16 1131  BP: (!) 130/92 (!) 141/100  Pulse: 72 74  Resp: 17   Temp: 97.8 F (36.6 C)    No drop in oxygen saturation throughout chest pain.  Exam: Gen:  Initially in moderate acute distress -> resolved to no distress CV:  RRR, no murmur Resp:  CTAB anteriorly Ext:  2+ pitting edema to knee  SVE:  4.5/60/-3  Assessment: 36 yo G3P1011 at 38.3 undergoing IOL for gestational hypertension.   Acute chest pain was concerning for amniotic fluid embolus, but recovery too quick to be c/w that.  Unclear etiology of acute pain, possibly related to reflux.  Plan: -continue IOL with pitocin, IUPC in place -recheck CBC, CMP, P:C -12 lead EKG -protonix 40 mg IV once  Seen and discussed with Dr. Genevie AnnSchenk and Dr. Philippa SicksHarroway-Smith  [name] [name]

## 2016-08-03 NOTE — Progress Notes (Addendum)
S: Doing well.  Sleepy following administration of benadryl and magnesium.  Off pitocin since around 12PM.  Vitals:   08/03/16 1401 08/03/16 1416  BP: (!) 142/75 (!) 148/82  Pulse: 88 87  Resp:  18  Temp:      FHT:    SVE: 09/7078%/-2 at 1341   A/P  36 yo G3P1011 at 38.3 undergoing IOL for gestational hypertension, now with preeclampsia with severe features.  -continue mag -continue IOL, restart pitocin, IUPC in place, MVUs inadequate, 65-150 -trend CBC, CMP q8h -anticipate NSVD  [name] MerlesJulia Almas Rake, MD Family medicine resident PGY3

## 2016-08-03 NOTE — Progress Notes (Signed)
LABOR PROGRESS NOTE  [name] [name] is a 36 y.o. G3P1011 at 3198w3d  Subjective: Sleepy on mag, but doing well.  Feeling intermittent pressure in her rectum.    Objective: BP (!) 158/90   Pulse 96   Temp 98 F (36.7 C) (Oral)   Resp 17   Ht 5\' 7"  (1.702 m)   Wt 247 lb (112 kg)   LMP 11/08/2015   SpO2 95%   BMI 38.69 kg/m    Dilation: 6 Effacement (%): 70, 80 Cervical Position: Middle Station: 0 Presentation: Vertex Exam by:: dr schenk  17:14  Assessment / Plan: 36 yo G3P1011 at 38.3 undergoing IOL for gestational hypertension, now with preeclampsia with severe features.  Labor: cont IOL, on pitocin, IUPC in place, MVUs intermittently adequate Preeclampsia:  Trend CBC, CMP q8h (next 2030), mag level, cont mag Fetal Wellbeing:  Cat II due to minimal variability, reacts to scalp stim, none recently Pain Control:  epidural Anticipated MOD:  NSVD  Charlsie MerlesJulia Rhoden, MD 08/03/2016, 7:35 PM

## 2016-08-03 NOTE — Progress Notes (Signed)
Patient resting in bed with eyes closed. Foley bulb still in place. Pitocin at 6 milliunits now.  S/P 2 doses of cytotec (25 mcg vaginal).  FHR is 135 bpm; moderate variability, present accels, negative decels.

## 2016-08-04 LAB — COMPREHENSIVE METABOLIC PANEL
ALT: 15 U/L (ref 14–54)
AST: 24 U/L (ref 15–41)
Albumin: 2.2 g/dL — ABNORMAL LOW (ref 3.5–5.0)
Alkaline Phosphatase: 136 U/L — ABNORMAL HIGH (ref 38–126)
Anion gap: 8 (ref 5–15)
CHLORIDE: 102 mmol/L (ref 101–111)
CO2: 20 mmol/L — ABNORMAL LOW (ref 22–32)
CREATININE: 0.73 mg/dL (ref 0.44–1.00)
Calcium: 7.7 mg/dL — ABNORMAL LOW (ref 8.9–10.3)
GFR calc Af Amer: >60 mL/min (ref >60)
Glucose, Bld: 107 mg/dL — ABNORMAL HIGH (ref 65–99)
Potassium: 3 mmol/L — ABNORMAL LOW (ref 3.5–5.1)
Sodium: 130 mmol/L — ABNORMAL LOW (ref 135–145)
Total Bilirubin: <0.1 mg/dL — ABNORMAL LOW (ref 0.3–1.2)
Total Protein: 5.6 g/dL — ABNORMAL LOW (ref 6.5–8.1)

## 2016-08-04 LAB — CBC
HCT: 27.5 % — ABNORMAL LOW (ref 36.0–46.0)
Hemoglobin: 9.2 g/dL — ABNORMAL LOW (ref 12.0–15.0)
MCH: 24.9 pg — ABNORMAL LOW (ref 26.0–34.0)
MCHC: 33.5 g/dL (ref 30.0–36.0)
MCV: 74.5 fL — AB (ref 78.0–100.0)
PLATELETS: 269 10*3/uL (ref 150–400)
RBC: 3.69 MIL/uL — ABNORMAL LOW (ref 3.87–5.11)
RDW: 14.1 % (ref 11.5–15.5)
WBC: 20.6 10*3/uL — ABNORMAL HIGH (ref 4.0–10.5)

## 2016-08-04 LAB — MAGNESIUM: Magnesium: 5.1 mg/dL — ABNORMAL HIGH (ref 1.7–2.4)

## 2016-08-04 NOTE — Progress Notes (Signed)
POSTPARTUM PROGRESS NOTE  Post Partum Day 1 Subjective:  [name] [name] is a 36 y.o. [id number] 7614w3d s/p NSVD.  Febrile to 102 overnight associated with severe shaking about 3 hours after delivery.  Feeling well this morning.  Pt denies problems with ambulating or po intake.  She still has a Foley catheter in place.  She denies nausea or vomiting.  Pain is moderately controlled.  Lochia Moderate. She does not have headache, blurred vision, RUQ pain, shortness of breath.  She does have some leg swelling.  Objective: Blood pressure 136/69, pulse 98, temperature 98.4 F (36.9 C), resp. rate 18, height 5\' 7"  (1.702 m), weight 247 lb (112 kg), last menstrual period 11/08/2015, SpO2 96 %, unknown if currently breastfeeding.  Physical Exam:  General: alert, cooperative and no distress Lochia:normal flow Chest: no respiratory distress, clear to auscultation Heart:regular rate, distal pulses intact Abdomen: soft, nontender Uterine Fundus: firm, appropriately tender DVT Evaluation: No calf swelling or tenderness Extremities: 1+ edema   Recent Labs  08/03/16 1245 08/03/16 2037  HGB 9.2* 9.8*  HCT 28.3* 30.9*    Assessment/Plan:  ASSESSMENT: [name] [name] is a 36 y.o. [id number] 7414w3d s/p NSVD.  Preeclampsia with severe features:  Continue magnesium until 24h pp.  Repeat CBC, CMP ordered.  Postpartum fever:  Likely related to Cytotec administration, will monitor.  Routine postpartum care:  Bottle feeding, plans for outpatient BTL  Anticipate DC home tomorrow.   LOS: 3 days   Amil AmenJulia RhodenMD 08/04/2016, 7:53 AM   I have seen and examined this patient and I agree with the above. Cam HaiSHAW, Aamir Mclinden CNM 9:14 AM 08/04/2016

## 2016-08-04 NOTE — Anesthesia Postprocedure Evaluation (Signed)
Anesthesia Post Note  Patient: [name] [name]  Procedure(s) Performed: * No procedures listed *  Patient location during evaluation: [facility] Anesthesia Type: Epidural Level of consciousness: awake, awake and alert, oriented and patient cooperative Pain management: pain level controlled Vital Signs Assessment: post-procedure vital signs reviewed and stable Respiratory status: spontaneous breathing, nonlabored ventilation and respiratory function stable Cardiovascular status: stable Postop Assessment: no headache, no backache, epidural receding, patient able to bend at knees and no signs of nausea or vomiting Anesthetic complications: no        Last Vitals:  Vitals:   08/04/16 0430 08/04/16 0530  BP: 136/69   Pulse: 98   Resp: 18 18  Temp: 36.9 C     Last Pain:  Vitals:   08/04/16 0249  TempSrc:   PainSc: Asleep   Pain Goal:                 [name]

## 2016-08-05 MED ORDER — AMLODIPINE BESYLATE 5 MG PO TABS
5.0000 mg | ORAL_TABLET | Freq: Every day | ORAL | Status: DC
  Administered 2016-08-05: 5 mg via ORAL
  Filled 2016-08-05: qty 1

## 2016-08-05 MED ORDER — AMLODIPINE BESYLATE 5 MG PO TABS
5.0000 mg | ORAL_TABLET | Freq: Once | ORAL | Status: AC
  Administered 2016-08-05: 5 mg via ORAL
  Filled 2016-08-05: qty 1

## 2016-08-05 MED ORDER — IBUPROFEN 600 MG PO TABS: 600.0000 mg | ORAL_TABLET | Freq: Four times a day (QID) | ORAL | 0 refills | Status: DC

## 2016-08-05 MED ORDER — AMLODIPINE BESYLATE 5 MG PO TABS: 10.0000 mg | ORAL_TABLET | Freq: Every day | ORAL | 0 refills | Status: DC

## 2016-08-05 MED ORDER — ENALAPRIL MALEATE 5 MG PO TABS: 5.0000 mg | ORAL_TABLET | Freq: Every day | ORAL | Status: DC

## 2016-08-05 MED ORDER — SENNOSIDES-DOCUSATE SODIUM 8.6-50 MG PO TABS: 2.0000 | ORAL_TABLET | ORAL | 0 refills | Status: DC

## 2016-08-05 NOTE — Discharge Instructions (Signed)

## 2016-08-05 NOTE — Discharge Summary (Signed)
OB Discharge Summary     Patient Name: [name] [name] DOB: Jan 17, 1981 MRN: [id number]  Date of admission: 08/01/2016 Delivering MD: Carola Rhine D   Date of discharge: 08/05/2016  Admitting diagnosis: 35w elevated blood work, labs Intrauterine pregnancy: [redacted]w[redacted]d     Secondary diagnosis:  Active Problems:   Gestational hypertension   Gestational HTN  Additional problems: Preeclampsia with severe features     Discharge diagnosis: Term Pregnancy Delivered, Gestational Hypertension and Preeclampsia (severe)                                                                                                Post partum procedures:Magnesium gtt  Augmentation: AROM, Pitocin, Cytotec and Foley Balloon  Complications: None  Hospital course:  Induction of Labor With Vaginal Delivery   36 y.o. yo [id number] at [redacted]w[redacted]d was admitted to the hospital 08/01/2016 for induction of labor.  Indication for induction: Gestational hypertension.  Patient had an uncomplicated labor course as follows: Membrane Rupture Time/Date: 11:57 AM ,08/03/2016   Intrapartum Procedures: Episiotomy: None [1]                                         Lacerations:  1st degree [2]  During labor course, patient developed preeclampsia with severe features. Patient had delivery of a Viable infant.  Information for the patient's newborn:  Boni, Maclellan [[id number]]  Delivery Method: Vag-Spont   08/03/2016  Details of delivery can be found in separate delivery note.  Patient had a routine postpartum course. Patient is discharged home 08/05/16.  Physical exam  Vitals:   08/05/16 1021 08/05/16 1200 08/05/16 1425 08/05/16 1600  BP: (!) 152/71 (!) 142/82 133/76 136/72  Pulse: (!) 51 (!) 59 61 66  Resp: 16 18 18 18   Temp:  98.9 F (37.2 C)  98.5 F (36.9 C)  TempSrc:  Oral  Oral  SpO2: 100% 99% 98% 97%  Weight:      Height:       General: alert, cooperative and no distress Lochia: appropriate Uterine Fundus: firm Incision:  N/A DVT Evaluation: No evidence of DVT seen on physical exam. Negative Homan's sign. No cords or calf tenderness. Labs: Lab Results  Component Value Date   WBC 20.6 (H) 08/04/2016   HGB 9.2 (L) 08/04/2016   HCT 27.5 (L) 08/04/2016   MCV 74.5 (L) 08/04/2016   PLT 269 08/04/2016   CMP Latest Ref Rng & Units 08/04/2016  Glucose 65 - 99 mg/dL 528(U)  BUN 6 - 20 mg/dL <1(L)  Creatinine 2.44 - 1.00 mg/dL 0.10  Sodium 272 - 536 mmol/L 130(L)  Potassium 3.5 - 5.1 mmol/L 3.0(L)  Chloride 101 - 111 mmol/L 102  CO2 22 - 32 mmol/L 20(L)  Calcium 8.9 - 10.3 mg/dL 7.7(L)  Total Protein 6.5 - 8.1 g/dL 6.4(Q)  Total Bilirubin 0.3 - 1.2 mg/dL <0.3(K)  Alkaline Phos 38 - 126 U/L 136(H)  AST 15 - 41 U/L 24  ALT 14 - 54 U/L 15  Discharge instruction: per After Visit Summary and "Baby and Me Booklet".  After visit meds:  Allergies as of 08/05/2016   No Known Allergies     Medication List    TAKE these medications   acetaminophen 325 MG tablet Commonly known as:  TYLENOL Take 325 mg by mouth every 6 (six) hours as needed for moderate pain.   amLODipine 5 MG tablet Commonly known as:  NORVASC Take 2 tablets (10 mg total) by mouth daily. Start taking on:  08/06/2016   ibuprofen 600 MG tablet Commonly known as:  ADVIL,MOTRIN Take 1 tablet (600 mg total) by mouth every 6 (six) hours.   iron polysaccharides 150 MG capsule Commonly known as:  NIFEREX Take 1 capsule (150 mg total) by mouth daily.   PRENATAL GUMMIES/DHA & FA 0.4-32.5 MG Chew Chew 1 tablet by mouth daily.   senna-docusate 8.6-50 MG tablet Commonly known as:  Senokot-S Take 2 tablets by mouth daily. Start taking on:  08/06/2016       Diet: low salt diet  Activity: Advance as tolerated. Pelvic rest for 6 weeks.   Outpatient follow up:6 weeks, 1 week RN BP check. Follow up Appt:Future Appointments Date Time Provider Department Center  09/08/2016 2:40 PM Aviva SignsMarie L Williams, CNM [facility]   Follow up Visit:No  Follow-up on file.  Postpartum contraception: Interval BTL  Newborn Data: Live born female  Birth Weight: 7 lb 12.4 oz (3527 g) APGAR: 7, 9  Baby Feeding: Bottle Disposition:home with mother   08/05/2016 Jen MowElizabeth Angy Swearengin, DO OB Fellow

## 2016-08-05 NOTE — Progress Notes (Signed)
Pt out in wheelchair  Teaching  Complete    Pt to follow up with b/p checks and can take b/p at home  And report as needed  Infant book reviewed  And postpartum care  With breast  Care  With bottlefeeding mom

## 2016-08-05 NOTE — Discharge Summary (Deleted)
Obstetric Discharge Summary Reason for Admission: induction of labor Prenatal Procedures: none Intrapartum Procedures: spontaneous vaginal delivery Postpartum Procedures: none Complications-Operative and Postpartum: preeclampsia w/ severe features and anemia Hbg 8.8 Hemoglobin  Date Value Ref Range Status  08/04/2016 9.2 (L) 12.0 - 15.0 g/dL Final   HCT  Date Value Ref Range Status  08/04/2016 27.5 (L) 36.0 - 46.0 % Final    Physical Exam:  General: alert Lochia: appropriate Uterine Fundus: firm Incision: n/a DVT Evaluation: No evidence of DVT seen on physical exam.  Discharge Diagnoses: Term Pregnancy-delivered  Discharge Information: Date: 08/05/2016 Activity: unrestricted, no intercourse for the next 6 weeks Diet: routine Medications: Ibuprofen, Iron and amlodipine 10mg  Condition: stable Instructions: follow up as below Discharge to: home Follow-up Information    [facility] Follow up.   Why:  as needed for emergency Contact information: [address] [id number] [place] [phone]       [facility] in 1 week(s).   Specialty:  Obstetrics and Gynecology Why:  for repeat blood pressure Contact information: [address] [place] [phone]          Allergies as of 08/05/2016   No Known Allergies     Medication List    TAKE these medications   acetaminophen 325 MG tablet Commonly known as:  TYLENOL Take 325 mg by mouth every 6 (six) hours as needed for moderate pain.   amLODipine 5 MG tablet Commonly known as:  NORVASC Take 2 tablets (10 mg total) by mouth daily. Start taking on:  08/06/2016   ibuprofen 600 MG tablet Commonly known as:  ADVIL,MOTRIN Take 1 tablet (600 mg total) by mouth every 6 (six) hours.   iron polysaccharides 150 MG capsule Commonly known as:  NIFEREX Take 1 capsule (150 mg total)  by mouth daily.   PRENATAL GUMMIES/DHA & FA 0.4-32.5 MG Chew Chew 1 tablet by mouth daily.   senna-docusate 8.6-50 MG tablet Commonly known as:  Senokot-S Take 2 tablets by mouth daily. Start taking on:  08/06/2016       Continuous Infusions: PRN Meds:.acetaminophen, benzocaine-Menthol, coconut oil, witch hazel-glycerin **AND** dibucaine, diphenhydrAMINE, ondansetron **OR** ondansetron (ZOFRAN) IV, simethicone, zolpidem  Newborn Data: Live born female  Birth Weight: 7 lb 12.4 oz (3527 g) APGAR: 7, 9  Home with mother.  Nehemiah SettleAna Carvalho do Silvio ClaymanAmaral MD PGY1 08/05/2016, 4:29 PM

## 2016-08-05 NOTE — Progress Notes (Signed)
Pt had BP of 156/84 when checked at 02:55. Notified CNM Cresenzo-Dishmon. No new orders at this time. Told to continue monitoring pt and notify if SBP gets above 160 and DBP gets above 110. Will continue to monitor.

## 2016-08-09 ENCOUNTER — Encounter: Payer: BLUE CROSS/BLUE SHIELD | Admitting: Advanced Practice Midwife

## 2016-09-08 ENCOUNTER — Ambulatory Visit: Payer: BLUE CROSS/BLUE SHIELD | Admitting: Advanced Practice Midwife

## 2016-09-08 ENCOUNTER — Telehealth: Payer: Self-pay | Admitting: *Deleted

## 2016-09-08 ENCOUNTER — Encounter: Payer: Self-pay | Admitting: *Deleted

## 2016-09-08 NOTE — Telephone Encounter (Signed)
[name] [name] missed her postpartum appointment. Due to hx GHTN needs to be rescheduled. I called her and left a message she missed her appointment- please call and schedule. I will also send a certified letter.

## 2018-09-05 ENCOUNTER — Encounter: Payer: Self-pay | Admitting: *Deleted

## 2020-05-17 ENCOUNTER — Ambulatory Visit
Admission: RE | Admit: 2020-05-17 | Discharge: 2020-05-17 | Disposition: A | Discharge status: Home / Self Care | Payer: BLUE CROSS/BLUE SHIELD | Source: Ambulatory Visit | Attending: Emergency Medicine | Admitting: Emergency Medicine

## 2020-05-17 ENCOUNTER — Other Ambulatory Visit: Payer: Self-pay

## 2020-05-17 VITALS — BP 172/110 | HR 96 | Temp 99.0°F | Resp 18 | Ht 67.0 in | Wt 246.9 lb

## 2020-05-17 DIAGNOSIS — K0889 Other specified disorders of teeth and supporting structures: Secondary | ICD-10-CM

## 2020-05-17 DIAGNOSIS — K047 Periapical abscess without sinus: Secondary | ICD-10-CM | POA: Diagnosis not present

## 2020-05-17 DIAGNOSIS — R6884 Jaw pain: Secondary | ICD-10-CM

## 2020-05-17 MED ORDER — NAPROXEN 500 MG PO TABS: 500.0000 mg | ORAL_TABLET | Freq: Two times a day (BID) | ORAL | 0 refills | Status: DC

## 2020-05-17 MED ORDER — CHLORHEXIDINE GLUCONATE 0.12 % MT SOLN: 15.0000 mL | Freq: Two times a day (BID) | OROMUCOSAL | 0 refills | Status: DC

## 2020-05-17 MED ORDER — AMOXICILLIN-POT CLAVULANATE 875-125 MG PO TABS: 1.0000 | ORAL_TABLET | Freq: Two times a day (BID) | ORAL | 0 refills | Status: DC

## 2020-05-17 NOTE — Discharge Instructions (Addendum)
  Naproxen prescribed.  Use as directed for pain relief Chlorhexidine and Augmentin were prescribed Recommend soft diet until evaluated by dentist Maintain oral hygiene care Follow up with dentist as soon as possible for further evaluation and treatment  Return or go to the ED if you have any new or worsening symptoms such as fever, chills, difficulty swallowing, painful swallowing, oral or neck swelling, nausea, vomiting, chest pain, SOB, etc... 

## 2020-05-17 NOTE — ED Triage Notes (Signed)
Swelling and pain to LT lower jaw since Friday

## 2020-05-17 NOTE — ED Provider Notes (Signed)
[facility]   [id number] 05/17/20 Arrival Time: 1440  CC: DENTAL PAIN  SUBJECTIVE:  [name] [name] is a 39 y.o. female who presents to the urgent care for complaint of swelling and pain to the left  jaw pain that started this past 2 days.  Denies a precipitating event or trauma.  Localizes pain to left upper jaw.  Has tried OTC analgesics without relief.  Worse with chewing.  Does not see a dentist regularly.  Denies similar symptoms in the past.  Denies fever, chills, dysphagia, odynophagia, oral or neck swelling, nausea, vomiting, chest pain, SOB.    ROS: As per HPI.  All other pertinent ROS negative.     Past Medical History:  Diagnosis Date   Complication of anesthesia    difficulty breathing after surgery    Hypertension    Past Surgical History:  Procedure Laterality Date   CHOLECYSTECTOMY     PILONIDAL CYST EXCISION     No Known Allergies No current facility-administered medications on file prior to encounter.   Current Outpatient Medications on File Prior to Encounter  Medication Sig Dispense Refill   acetaminophen (TYLENOL) 325 MG tablet Take 325 mg by mouth every 6 (six) hours as needed for moderate pain.     amLODipine (NORVASC) 5 MG tablet Take 2 tablets (10 mg total) by mouth daily. 60 tablet 0   ibuprofen (ADVIL,MOTRIN) 600 MG tablet Take 1 tablet (600 mg total) by mouth every 6 (six) hours. 30 tablet 0   iron polysaccharides (NIFEREX) 150 MG capsule Take 1 capsule (150 mg total) by mouth daily. (Patient not taking: Reported on 08/01/2016) 30 capsule 3   Prenatal MV-Min-FA-Omega-3 (PRENATAL GUMMIES/DHA & FA) 0.4-32.5 MG CHEW Chew 1 tablet by mouth daily.     senna-docusate (SENOKOT-S) 8.6-50 MG tablet Take 2 tablets by mouth daily. 30 tablet 0   Social History   Socioeconomic History   Marital status: Single    Spouse name: Not on file   Number of children: Not on file   Years of education: Not on file   Highest education level: Not on  file  Occupational History   Not on file  Tobacco Use   Smoking status: Former Smoker   Smokeless tobacco: Never Used  Substance and Sexual Activity   Alcohol use: No   Drug use: No   Sexual activity: Yes    Birth control/protection: None  Other Topics Concern   Not on file  Social History Narrative   Not on file   Social Determinants of Health   Financial Resource Strain: Not on file  Food Insecurity: Not on file  Transportation Needs: Not on file  Physical Activity: Not on file  Stress: Not on file  Social Connections: Not on file  Intimate Partner Violence: Not on file   Family History  Problem Relation Age of Onset   Cancer Other    Diabetes Other    Hypertension Father     OBJECTIVE:  Vitals:   05/17/20 1536 05/17/20 1539  BP:  (!) 172/110  Pulse:  96  Resp:  18  Temp:  99 F (37.2 C)  TempSrc:  Oral  SpO2:  98%  Weight: 246 lb 14.6 oz (112 kg)   Height: 5\' 7"  (1.702 m)     General appearance: alert; no distress HENT: normocephalic; atraumatic; dentition: multiple carries; swollen over left upper gums with areas of fluctuance Neck: supple without LAD Lungs: normal respirations Skin: warm and dry Psychological: alert and cooperative;  normal mood and affect  ASSESSMENT & PLAN:  No diagnosis found.  Meds ordered this encounter  Medications   amoxicillin-clavulanate (AUGMENTIN) 875-125 MG tablet    Sig: Take 1 tablet by mouth every 12 (twelve) hours.    Dispense:  14 tablet    Refill:  0   chlorhexidine (PERIDEX) 0.12 % solution    Sig: Use as directed 15 mLs in the mouth or throat 2 (two) times daily.    Dispense:  120 mL    Refill:  0   naproxen (NAPROSYN) 500 MG tablet    Sig: Take 1 tablet (500 mg total) by mouth 2 (two) times daily.    Dispense:  30 tablet    Refill:  0    Discharge instructions  Naproxen prescribed.  Use as directed for pain relief Chlorhexidine and Augmentin were prescribed Recommend soft diet until  evaluated by dentist Maintain oral hygiene care Follow up with dentist as soon as possible for further evaluation and treatment  Return or go to the ED if you have any new or worsening symptoms such as fever, chills, difficulty swallowing, painful swallowing, oral or neck swelling, nausea, vomiting, chest pain, SOB, etc...  Reviewed expectations re: course of current medical issues. Questions answered. Outlined signs and symptoms indicating need for more acute intervention. Patient verbalized understanding. After Visit Summary given.   Durward Parcel, FNP 05/17/20 [phone]

## 2021-02-02 ENCOUNTER — Emergency Department (HOSPITAL_COMMUNITY)
Admission: EM | Admit: 2021-02-02 | Discharge: 2021-02-03 | Disposition: A | Discharge status: Home / Self Care | Payer: Managed Care, Other (non HMO) | Attending: Emergency Medicine | Admitting: Emergency Medicine

## 2021-02-02 ENCOUNTER — Emergency Department (HOSPITAL_COMMUNITY): Payer: Managed Care, Other (non HMO)

## 2021-02-02 ENCOUNTER — Other Ambulatory Visit: Payer: Self-pay

## 2021-02-02 ENCOUNTER — Encounter (HOSPITAL_COMMUNITY): Payer: Self-pay

## 2021-02-02 DIAGNOSIS — Z87891 Personal history of nicotine dependence: Secondary | ICD-10-CM | POA: Diagnosis not present

## 2021-02-02 DIAGNOSIS — R0789 Other chest pain: Secondary | ICD-10-CM | POA: Insufficient documentation

## 2021-02-02 DIAGNOSIS — I10 Essential (primary) hypertension: Secondary | ICD-10-CM | POA: Diagnosis not present

## 2021-02-02 DIAGNOSIS — R0600 Dyspnea, unspecified: Secondary | ICD-10-CM | POA: Diagnosis not present

## 2021-02-02 DIAGNOSIS — Z79899 Other long term (current) drug therapy: Secondary | ICD-10-CM | POA: Insufficient documentation

## 2021-02-02 DIAGNOSIS — R079 Chest pain, unspecified: Secondary | ICD-10-CM

## 2021-02-02 LAB — BASIC METABOLIC PANEL
Anion gap: 3 — ABNORMAL LOW (ref 5–15)
BUN: 13 mg/dL (ref 6–20)
CO2: 29 mmol/L (ref 22–32)
Calcium: 8.8 mg/dL — ABNORMAL LOW (ref 8.9–10.3)
Chloride: 103 mmol/L (ref 98–111)
Creatinine, Ser: 0.88 mg/dL (ref 0.44–1.00)
GFR, Estimated: >60 mL/min (ref >60)
Glucose, Bld: 101 mg/dL — ABNORMAL HIGH (ref 70–99)
Potassium: 3.8 mmol/L (ref 3.5–5.1)
Sodium: 135 mmol/L (ref 135–145)

## 2021-02-02 LAB — CBC
HCT: 40.7 % (ref 36.0–46.0)
Hemoglobin: 12.9 g/dL (ref 12.0–15.0)
MCH: 28.3 pg (ref 26.0–34.0)
MCHC: 31.7 g/dL (ref 30.0–36.0)
MCV: 89.3 fL (ref 80.0–100.0)
Platelets: 304 10*3/uL (ref 150–400)
RBC: 4.56 MIL/uL (ref 3.87–5.11)
RDW: 15.3 % (ref 11.5–15.5)
WBC: 7.2 10*3/uL (ref 4.0–10.5)
nRBC: 0 % (ref 0.0–0.2)

## 2021-02-02 LAB — TROPONIN I (HIGH SENSITIVITY)
Troponin I (High Sensitivity): 4 ng/L (ref <18)
Troponin I (High Sensitivity): 5 ng/L (ref <18)

## 2021-02-02 LAB — HCG, QUANTITATIVE, PREGNANCY: hCG, Beta Chain, Quant, S: <1 m[IU]/mL (ref <5)

## 2021-02-02 NOTE — ED Notes (Signed)
Pt reports that she used to take medications for hypertension but stopped taking them after she heard they cause cancer.

## 2021-02-02 NOTE — ED Triage Notes (Signed)
POV from home with cc of CP and SOB.  Center-left side  CP and SOB x a a few weeks.   Aug 18-19 possible start date. Has been seen before for this.

## 2021-02-03 MED ORDER — AMLODIPINE BESYLATE 5 MG PO TABS
10.0000 mg | ORAL_TABLET | Freq: Once | ORAL | Status: AC
  Administered 2021-02-03: 10 mg via ORAL
  Filled 2021-02-03: qty 2

## 2021-02-03 MED ORDER — ACETAMINOPHEN 325 MG PO TABS
650.0000 mg | ORAL_TABLET | Freq: Once | ORAL | Status: AC
  Administered 2021-02-03: 650 mg via ORAL
  Filled 2021-02-03: qty 2

## 2021-02-03 MED ORDER — AMLODIPINE BESYLATE 10 MG PO TABS: 10.0000 mg | ORAL_TABLET | Freq: Every day | ORAL | 0 refills | Status: DC

## 2021-02-03 NOTE — Discharge Instructions (Addendum)
Your evaluation today did not show any serious cause for your chest pain.  However, if symptoms are getting worse, please return for further evaluation.  Otherwise, follow-up with a cardiologist for further outpatient evaluation.  Your blood pressure was very high today.  You are being given a prescription for the amlodipine which she had been taking previously.  Please take this once a day.  Also, please check your blood pressure once a day and keep a record of it.  Take this record with you when you see your physician.  This is something that you should continue doing.

## 2021-02-03 NOTE — ED Provider Notes (Signed)
[facility] EMERGENCY DEPARTMENT Provider Note   CSN: [id number] Arrival date & time: 02/02/21  1831     History Chief Complaint  Patient presents with   Chest Pain    [name] [name] is a 40 y.o. female.  The history is provided by the patient.  Chest Pain She has history of hypertension and comes in because of chest pain.  Pain is described as a heavy, pressure feeling over the mid chest with some radiation to the left shoulder.  And has been intermittent over the last 3 weeks.  Symptoms have been stable over that time.  Nothing seems to make it better, nothing makes it worse.  Discomfort would be there for several hours at a time.  There is some associated dyspnea but no nausea or diaphoresis.  She denies any cough or fever.  She had a similar episode in the past but does not recall what the diagnosis was.  She is a non-smoker and denies history of diabetes or hyperlipidemia and denies family history of premature coronary atherosclerosis.  Of note, she has not taken her blood pressure medication because she heard a news report stating that it was associated with cancer.  She currently does not have a primary care provider.   Past Medical History:  Diagnosis Date   Complication of anesthesia    difficulty breathing after surgery    Hypertension     Patient Active Problem List   Diagnosis Date Noted   Gestational hypertension 08/01/2016   Gestational HTN 08/01/2016   Uterine size date discrepancy pregnancy, third trimester 07/28/2016   Hemorrhoids during pregnancy, antepartum 06/27/2016   Trichomonal vaginitis during pregnancy in third trimester 06/02/2016   Supervision of low-risk pregnancy 05/19/2016   Late prenatal care affecting pregnancy, antepartum 05/19/2016   Obesity affecting pregnancy, antepartum 05/19/2016    Past Surgical History:  Procedure Laterality Date   CHOLECYSTECTOMY     PILONIDAL CYST EXCISION       OB History     Gravida  3   Para  2   Term  2    Preterm  0   AB  1   Living  2      SAB  0   IAB  1   Ectopic  0   Multiple  0   Live Births  2           Family History  Problem Relation Age of Onset   Cancer Other    Diabetes Other    Hypertension Father     Social History   Tobacco Use   Smoking status: Former   Smokeless tobacco: Never  Substance Use Topics   Alcohol use: No   Drug use: No    Home Medications Prior to Admission medications   Medication Sig Start Date End Date Taking? Authorizing Provider  acetaminophen (TYLENOL) 325 MG tablet Take 325 mg by mouth every 6 (six) hours as needed for moderate pain.    [provider]  amLODipine (NORVASC) 5 MG tablet Take 2 tablets (10 mg total) by mouth daily. 08/06/16   Adair Laundry, MD  amoxicillin-clavulanate (AUGMENTIN) 875-125 MG tablet Take 1 tablet by mouth every 12 (twelve) hours. 05/17/20   Avegno, Zachery Dakins, FNP  chlorhexidine (PERIDEX) 0.12 % solution Use as directed 15 mLs in the mouth or throat 2 (two) times daily. 05/17/20   Avegno, Zachery Dakins, FNP  ibuprofen (ADVIL,MOTRIN) 600 MG tablet Take 1 tablet (600 mg total) by mouth  every 6 (six) hours. 08/05/16   Adair Laundry, MD  iron polysaccharides (NIFEREX) 150 MG capsule Take 1 capsule (150 mg total) by mouth daily. Patient not taking: Reported on 08/01/2016 05/25/16   Donette Larry, CNM  naproxen (NAPROSYN) 500 MG tablet Take 1 tablet (500 mg total) by mouth 2 (two) times daily. 05/17/20   Avegno, Zachery Dakins, FNP  Prenatal MV-Min-FA-Omega-3 (PRENATAL GUMMIES/DHA & FA) 0.4-32.5 MG CHEW Chew 1 tablet by mouth daily.    [provider]  senna-docusate (SENOKOT-S) 8.6-50 MG tablet Take 2 tablets by mouth daily. 08/06/16   Adair Laundry, MD    Allergies    Patient has no known allergies.  Review of Systems   Review of Systems  Cardiovascular:  Positive for chest pain.  All other systems reviewed and are negative.  Physical Exam Updated Vital  Signs BP (!) 176/109   Pulse 69   Temp 98.4 F (36.9 C) (Oral)   Resp 15   Ht 5\' 7"  (1.702 m)   Wt 112 kg   LMP 01/17/2021 (Approximate)   SpO2 100%   BMI 38.67 kg/m   Physical Exam Vitals and nursing note reviewed.  40 year old female, resting comfortably and in no acute distress. Vital signs are significant for elevated blood pressure. Oxygen saturation is 100%, which is normal. Head is normocephalic and atraumatic. PERRLA, EOMI. Oropharynx is clear. Neck is nontender and supple without adenopathy or JVD. Back is nontender and there is no CVA tenderness. Lungs are clear without rales, wheezes, or rhonchi. Chest is nontender. Heart has regular rate and rhythm without murmur. Abdomen is soft, flat, nontender without masses or hepatosplenomegaly and peristalsis is normoactive. Extremities have no cyanosis or edema, full range of motion is present. Skin is warm and dry without rash. Neurologic: Mental status is normal, cranial nerves are intact, there are no motor or sensory deficits.  ED Results / Procedures / Treatments   Labs (all labs ordered are listed, but only abnormal results are displayed) Labs Reviewed  BASIC METABOLIC PANEL - Abnormal; Notable for the following components:      Result Value   Glucose, Bld 101 (*)    Calcium 8.8 (*)    Anion gap 3 (*)    All other components within normal limits  CBC  HCG, QUANTITATIVE, PREGNANCY  TROPONIN I (HIGH SENSITIVITY)  TROPONIN I (HIGH SENSITIVITY)    EKG EKG Interpretation  Date/Time:  Tuesday February 02 2021 19:59:11 EDT Ventricular Rate:  76 PR Interval:  150 QRS Duration: 88 QT Interval:  398 QTC Calculation: 447 R Axis:   37 Text Interpretation: Normal sinus rhythm Normal ECG When compared with ECG of 08/03/2016, No significant change was found Confirmed by 10/03/2016 (Dione Booze) on 02/02/2021 10:52:22 PM  Radiology DG Chest 2 View  Result Date: 02/02/2021 CLINICAL DATA:  Left side CP x 2-3 weeks EXAM: CHEST  - 2 VIEW COMPARISON:  Jan 24, 2007. FINDINGS: The heart size and mediastinal contours are within normal limits. No focal consolidation. No pleural effusion. No pneumothorax. The visualized skeletal structures are unremarkable. IMPRESSION: No active cardiopulmonary disease. Electronically Signed   By: Jan 26, 2007 M.D.   On: 02/02/2021 20:24    Procedures Procedures   Medications Ordered in ED Medications  amLODipine (NORVASC) tablet 10 mg (has no administration in time range)  acetaminophen (TYLENOL) tablet 650 mg (has no administration in time range)    ED Course  I have reviewed the triage vital signs and  the nursing notes.  Pertinent labs & imaging results that were available during my care of the patient were reviewed by me and considered in my medical decision making (see chart for details).   MDM Rules/Calculators/A&P                         Atypical chest pain.  ECG is normal, troponin is normal x2.  Chest x-ray is normal.  Elevated blood pressure with medication noncompliance.  I have looked up her record, and she had been on amlodipine 10 mg daily.  There was a recall of amlodipine, but it was a commendation pill with olmesartan and was only for specific lots.  This was explained to the patient.  Importance of blood pressure control was stressed.  Patient risk score via heart pathway is 2, which puts her at low risk for major adverse cardiac events.  She has no risk factors for pulmonary embolism, and has normal respiratory rate, heart rate, pulse oximetry.  She is felt to be safe for discharge, but is given outpatient referral to cardiology for follow-up.  Final Clinical Impression(s) / ED Diagnoses Final diagnoses:  Nonspecific chest pain  Elevated blood pressure reading with diagnosis of hypertension    Rx / DC Orders ED Discharge Orders          Ordered    amLODipine (NORVASC) 10 MG tablet  Daily        02/03/21 0052    Ambulatory referral to Cardiology        02/03/21  0052             Dione Booze, MD 02/03/21 0104

## 2021-03-02 ENCOUNTER — Ambulatory Visit: Payer: Managed Care, Other (non HMO) | Admitting: Cardiovascular Disease

## 2021-03-10 ENCOUNTER — Encounter: Payer: Self-pay | Admitting: Cardiology

## 2021-03-10 ENCOUNTER — Ambulatory Visit (INDEPENDENT_AMBULATORY_CARE_PROVIDER_SITE_OTHER): Payer: Managed Care, Other (non HMO)

## 2021-03-10 ENCOUNTER — Ambulatory Visit: Payer: Managed Care, Other (non HMO) | Admitting: Cardiology

## 2021-03-10 ENCOUNTER — Other Ambulatory Visit: Payer: Self-pay

## 2021-03-10 ENCOUNTER — Ambulatory Visit (INDEPENDENT_AMBULATORY_CARE_PROVIDER_SITE_OTHER): Payer: Managed Care, Other (non HMO) | Admitting: Cardiology

## 2021-03-10 VITALS — BP 167/105 | HR 105 | Ht 67.0 in | Wt 252.2 lb

## 2021-03-10 DIAGNOSIS — Z01818 Encounter for other preprocedural examination: Secondary | ICD-10-CM

## 2021-03-10 DIAGNOSIS — R002 Palpitations: Secondary | ICD-10-CM | POA: Diagnosis not present

## 2021-03-10 DIAGNOSIS — R0602 Shortness of breath: Secondary | ICD-10-CM | POA: Diagnosis not present

## 2021-03-10 DIAGNOSIS — R079 Chest pain, unspecified: Secondary | ICD-10-CM

## 2021-03-10 DIAGNOSIS — I10 Essential (primary) hypertension: Secondary | ICD-10-CM

## 2021-03-10 DIAGNOSIS — R400 Somnolence: Secondary | ICD-10-CM

## 2021-03-10 MED ORDER — AMLODIPINE BESYLATE 10 MG PO TABS: 10.0000 mg | ORAL_TABLET | Freq: Every day | ORAL | 3 refills | Status: DC

## 2021-03-10 MED ORDER — METOPROLOL TARTRATE 100 MG PO TABS
ORAL_TABLET | ORAL | 0 refills | Status: DC
Start: 2021-03-10 — End: 2021-06-16

## 2021-03-10 NOTE — Patient Instructions (Signed)
Medication Instructions:   Your physician recommends that you continue on your current medications as directed. Please refer to the Current Medication list given to you today.   I refilled your Amlodipine    *If you need a refill on your cardiac medications before your next appointment, please call your pharmacy*   Lab Work:  Bmet ,Urine HCG 1 week before cardiac ct  If you have labs (blood work) drawn today and your tests are completely normal, you will receive your results only by: MyChart Message (if you have MyChart) OR A paper copy in the mail If you have any lab test that is abnormal or we need to change your treatment, we will call you to review the results.   Testing/Procedures: Christena Deem- Long Term Monitor Instructions   Your physician has requested you wear your ZIO patch monitor___3____days.   This is a single patch monitor.  Ir[employer] supplies one patch monitor per enrollment.  Additional stickers are not available.   Please do not apply patch if you will be having a Nuclear Stress Test, Echocardiogram, Cardiac CT, MRI, or Chest Xray during the time frame you would be wearing the monitor. The patch cannot be worn during these tests.  You cannot remove and re-apply the ZIO XT patch monitor.  Do not shower for the first 24 hours.  You may shower after the first 24 hours.   Press button if you feel a symptom. You will hear a small click.  Record Date, Time and Symptom in the Patient Log Book.   When you are ready to remove patch, follow instructions on last 2 pages of Patient Log Book.  Stick patch monitor onto last page of Patient Log Book.   Place Patient Log Book in [place] box.  Use locking tab on box and tape box closed securely.  The Orange and [employer] has [employer] on it.  Please place in mailbox as soon as possible.  Your physician should have your test results approximately 7 days after the monitor has been mailed back to [facility].   Call [facility]  Customer Care at [phone] if you have questions regarding your ZIO XT patch monitor.  Call them immediately if you see an orange light blinking on your monitor.   If your monitor falls off in less than 4 days contact our Monitor department at [phone].  If your monitor becomes loose or falls off after 4 days call [employer] at [phone] for suggestions on securing your monitor.    Your physician has requested that you have cardiac CT. Cardiac computed tomography (CT) is a painless test that uses an x-ray machine to take clear, detailed pictures of your heart. For further information please visit [email or web address] Please follow instruction sheet as given.   Follow-Up: At [facility], you and your health needs are our priority.  As part of our continuing mission to provide you with exceptional heart care, we have created designated Provider Care Teams.  These Care Teams include your primary Cardiologist (physician) and Advanced Practice Providers (APPs -  Physician Assistants and Nurse Practitioners) who all work together to provide you with the care you need, when you need it.  We recommend signing up for the patient portal called "MyChart".  Sign up information is provided on this After Visit Summary.  MyChart is used to connect with patients for Virtual Visits (Telemedicine).  Patients are able to view lab/test results, encounter notes, upcoming appointments, etc.  Non-urgent messages can be sent to your  provider as well.   To learn more about what you can do with MyChart, go to [email or web address].    Your next appointment:   3 month(s)  The format for your next appointment:   In Person  Provider:    Dr.Schumann    Other Instructions:   You have been referred to pulmonary. They will call you to schedule appointment.    Check BP daily for 2 weeks and then call office to report readings, [phone]

## 2021-03-10 NOTE — Progress Notes (Signed)
Cardiology Office Note:    Date:  03/10/2021   ID:  [name] [name], DOB [birth year], MRN [id number]  PCP:  Pcp, No  Cardiologist:  Little Ishikawa, MD  Electrophysiologist:  None   Referring MD: Dione Booze, MD   Chief Complaint  Patient presents with   Chest Pain   Shortness of Breath    History of Present Illness:    [name] [name] is a 40 y.o. female with a hx of hypertension who presents as an ED follow-up for chest pain.  She was seen in the ED on 02/02/2021 with chest pain.  EKG, troponin unremarkable.  Noted to be hypertensive and discharged on amlodipine 10 mg daily.    Reports diagnosed with hypertension in 2018 after birth of last child. Stopped taking meds in 2018.  Reports he started having chest pain in August.  Has been occurring pretty much daily.  Happens randomly and can last for hours.  Reports pain across her whole chest.  Describes as pressure.  Also reports having dyspnea.  She does not exercise.  She denies any lightheadedness or syncope.  Also having episodes where she feels like her heart is racing.  She reports swelling in her ankles and feet.  She quit smoking few months ago, smoked for 3 years.  No history of heart disease in her immediate family.  She started taking amlodipine 10 mg daily after her ED visit but ran out last week.   Past Medical History:  Diagnosis Date   Complication of anesthesia    difficulty breathing after surgery    Hypertension     Past Surgical History:  Procedure Laterality Date   CHOLECYSTECTOMY     PILONIDAL CYST EXCISION      Current Medications: Current Meds  Medication Sig   acetaminophen (TYLENOL) 325 MG tablet Take 325 mg by mouth every 6 (six) hours as needed for moderate pain.   amphetamine-dextroamphetamine (ADDERALL) 30 MG tablet Take 1 tablet by mouth 2 (two) times daily.   aspirin 81 MG chewable tablet Chew 324 mg by mouth in the morning and at bedtime.   Garlic (GARLIQUE PO) Take 1 tablet by mouth  daily.   metoprolol tartrate (LOPRESSOR) 100 MG tablet Take 100 mg 2 hours before cardiac ct   NON FORMULARY Super beets-2 tablets daily   [DISCONTINUED] amLODipine (NORVASC) 10 MG tablet Take 1 tablet (10 mg total) by mouth daily.     Allergies:   Patient has no known allergies.   Social History   Socioeconomic History   Marital status: Single    Spouse name: Not on file   Number of children: Not on file   Years of education: Not on file   Highest education level: Not on file  Occupational History   Not on file  Tobacco Use   Smoking status: Former   Smokeless tobacco: Never  Substance and Sexual Activity   Alcohol use: No   Drug use: No   Sexual activity: Yes    Birth control/protection: None  Other Topics Concern   Not on file  Social History Narrative   Not on file   Social Determinants of Health   Financial Resource Strain: Not on file  Food Insecurity: Not on file  Transportation Needs: Not on file  Physical Activity: Not on file  Stress: Not on file  Social Connections: Not on file     Family History: The patient's family history includes Cancer in an other family member; Diabetes in an  other family member; Hypertension in her father.  ROS:   Please see the history of present illness.     All other systems reviewed and are negative.  EKGs/Labs/Other Studies Reviewed:    The following studies were reviewed today:   EKG:  EKG is  ordered today.  The ekg ordered today demonstrates sinus tachycardia, rate 105, no ST abnormalities  Recent Labs: 02/02/2021: BUN 13; Creatinine, Ser 0.88; Hemoglobin 12.9; Platelets 304; Potassium 3.8; Sodium 135  Recent Lipid Panel    Component Value Date/Time   TRIG 65 12/26/2006 0545    Physical Exam:    VS:  BP (!) 167/105 (BP Location: Right Arm, Patient Position: Sitting, Cuff Size: Large)   Pulse (!) 105   Ht 5\' 7"  (1.702 m)   Wt 252 lb 3.2 oz (114.4 kg)   LMP 02/17/2021   SpO2 97%   BMI 39.50 kg/m     Wt  Readings from Last 3 Encounters:  03/10/21 252 lb 3.2 oz (114.4 kg)  02/02/21 246 lb 14.6 oz (112 kg)  05/17/20 246 lb 14.6 oz (112 kg)     GEN:  Well nourished, well developed in no acute distress HEENT: Normal NECK: No JVD; No carotid bruits LYMPHATICS: No lymphadenopathy CARDIAC: RRR, no murmurs, rubs, gallops RESPIRATORY:  Clear to auscultation without rales, wheezing or rhonchi  ABDOMEN: Soft, non-tender, non-distended MUSCULOSKELETAL:  No edema; No deformity  SKIN: Warm and dry NEUROLOGIC:  Alert and oriented x 3 PSYCHIATRIC:  Normal affect   ASSESSMENT:    1. Chest pain, unspecified type   2. SOB (shortness of breath)   3. Daytime somnolence   4. Pre-op testing   5. Palpitations   6. Essential hypertension    PLAN:    Chest pain: Atypical in description but despite age she does have CAD risk factors (hypertension, tobacco use).  Recommend coronary CTA to rule out obstructive CAD.  Will give metoprolol 100 mg prior to study.  Palpitations: Description concerning for arrhythmia, will evaluate with Zio patch x3 days  Hypertension: Started on amlodipine 10 mg daily at recent ED visit but ran out.  Will restart amlodipine 10 mg daily.  Asked to check BP twice daily for next week and call with results.  Suspected OSA: Reports snoring, daytime somnolence.  Will refer for sleep study.  RTC in 3 months   Medication Adjustments/Labs and Tests Ordered: Current medicines are reviewed at length with the patient today.  Concerns regarding medicines are outlined above.  Orders Placed This Encounter  Procedures   CT CORONARY MORPH W/CTA COR W/SCORE W/CA W/CM &/OR WO/CM   Pregnancy, urine   Basic metabolic panel   Ambulatory referral to Pulmonology   LONG TERM MONITOR (3-14 DAYS)   EKG 12-Lead   Meds ordered this encounter  Medications   amLODipine (NORVASC) 10 MG tablet    Sig: Take 1 tablet (10 mg total) by mouth daily.    Dispense:  90 tablet    Refill:  3    metoprolol tartrate (LOPRESSOR) 100 MG tablet    Sig: Take 100 mg 2 hours before cardiac ct    Dispense:  1 tablet    Refill:  0    Patient Instructions  Medication Instructions:   Your physician recommends that you continue on your current medications as directed. Please refer to the Current Medication list given to you today.   I refilled your Amlodipine    *If you need a refill on your cardiac medications before your  next appointment, please call your pharmacy*   Lab Work:  Bmet ,Urine HCG 1 week before cardiac ct  If you have labs (blood work) drawn today and your tests are completely normal, you will receive your results only by: MyChart Message (if you have MyChart) OR A paper copy in the mail If you have any lab test that is abnormal or we need to change your treatment, we will call you to review the results.   Testing/Procedures: Christena Deem- Long Term Monitor Instructions   Your physician has requested you wear your ZIO patch monitor___3____days.   This is a single patch monitor.  [employer] supplies one patch monitor per enrollment.  Additional stickers are not available.   Please do not apply patch if you will be having a Nuclear Stress Test, Echocardiogram, Cardiac CT, MRI, or Chest Xray during the time frame you would be wearing the monitor. The patch cannot be worn during these tests.  You cannot remove and re-apply the ZIO XT patch monitor.  Do not shower for the first 24 hours.  You may shower after the first 24 hours.   Press button if you feel a symptom. You will hear a small click.  Record Date, Time and Symptom in the Patient Log Book.   When you are ready to remove patch, follow instructions on last 2 pages of Patient Log Book.  Stick patch monitor onto last page of Patient Log Book.   Place Patient Log Book in [place] box.  Use locking tab on box and tape box closed securely.  The [employer] and [employer] has [employer] on it.  Please place in mailbox as soon as  possible.  Your physician should have your test results approximately 7 days after the monitor has been mailed back to [facility].   Call [facility] Customer Care at [phone] if you have questions regarding your ZIO XT patch monitor.  Call them immediately if you see an orange light blinking on your monitor.   If your monitor falls off in less than 4 days contact our Monitor department at [phone].  If your monitor becomes loose or falls off after 4 days call [employer] at [phone] for suggestions on securing your monitor.    Your physician has requested that you have cardiac CT. Cardiac computed tomography (CT) is a painless test that uses an x-ray machine to take clear, detailed pictures of your heart. For further information please visit [email or web address] Please follow instruction sheet as given.   Follow-Up: At [facility], you and your health needs are our priority.  As part of our continuing mission to provide you with exceptional heart care, we have created designated Provider Care Teams.  These Care Teams include your primary Cardiologist (physician) and Advanced Practice Providers (APPs -  Physician Assistants and Nurse Practitioners) who all work together to provide you with the care you need, when you need it.  We recommend signing up for the patient portal called "MyChart".  Sign up information is provided on this After Visit Summary.  MyChart is used to connect with patients for Virtual Visits (Telemedicine).  Patients are able to view lab/test results, encounter notes, upcoming appointments, etc.  Non-urgent messages can be sent to your provider as well.   To learn more about what you can do with MyChart, go to [email or web address].    Your next appointment:   3 month(s)  The format for your next appointment:   In Person  Provider:    Dr.Najma Bozarth  Other Instructions:   You have been referred to pulmonary. They will call you to schedule  appointment.    Check BP daily for 2 weeks and then call office to report readings, [phone]     Signed, Little Ishikawa, MD  03/10/2021 5:59 PM     [facility]

## 2021-03-11 ENCOUNTER — Other Ambulatory Visit: Payer: Self-pay | Admitting: Cardiology

## 2021-03-11 NOTE — Telephone Encounter (Signed)
This is a [place] pt.  °

## 2021-03-17 ENCOUNTER — Telehealth (HOSPITAL_COMMUNITY): Payer: Self-pay | Admitting: Emergency Medicine

## 2021-03-17 NOTE — Telephone Encounter (Signed)
Reaching out to patient to offer assistance regarding upcoming cardiac imaging study; pt verbalizes understanding of appt date/time, parking situation and where to check in, pre-test NPO status and medications ordered, and verified current allergies; name and call back number provided for further questions should they arise Shaneice Barsanti RN Navigator Cardiac Imaging [facility] [phone] office [phone] cell  100mg metoprolol tart  Denies iv issues  Denies claustro  

## 2021-03-18 ENCOUNTER — Ambulatory Visit (HOSPITAL_COMMUNITY)
Admission: RE | Admit: 2021-03-18 | Discharge: 2021-03-18 | Disposition: A | Discharge status: Home / Self Care | Payer: Managed Care, Other (non HMO) | Source: Ambulatory Visit | Attending: Cardiology | Admitting: Cardiology

## 2021-03-18 ENCOUNTER — Other Ambulatory Visit: Payer: Self-pay

## 2021-03-18 DIAGNOSIS — R079 Chest pain, unspecified: Secondary | ICD-10-CM

## 2021-03-18 MED ORDER — DILTIAZEM HCL 25 MG/5ML IV SOLN
INTRAVENOUS | Status: AC
  Filled 2021-03-18: qty 5

## 2021-03-18 MED ORDER — NITROGLYCERIN 0.4 MG SL SUBL
SUBLINGUAL_TABLET | SUBLINGUAL | Status: AC
  Filled 2021-03-18: qty 1

## 2021-03-18 MED ORDER — NITROGLYCERIN 0.4 MG SL SUBL
0.8000 mg | SUBLINGUAL_TABLET | Freq: Once | SUBLINGUAL | Status: AC
  Administered 2021-03-18: 0.8 mg via SUBLINGUAL

## 2021-03-18 MED ORDER — IOHEXOL 350 MG/ML SOLN
95.0000 mL | Freq: Once | INTRAVENOUS | Status: AC | PRN
  Administered 2021-03-18: 95 mL via INTRAVENOUS

## 2021-03-18 MED ORDER — DILTIAZEM HCL 25 MG/5ML IV SOLN: 10.0000 mg | INTRAVENOUS | Status: DC | PRN

## 2021-03-18 MED ORDER — METOPROLOL TARTRATE 5 MG/5ML IV SOLN
INTRAVENOUS | Status: AC
  Administered 2021-03-18: 10 mg via INTRAVENOUS
  Filled 2021-03-18: qty 20

## 2021-03-18 MED ORDER — METOPROLOL TARTRATE 5 MG/5ML IV SOLN
10.0000 mg | INTRAVENOUS | Status: DC | PRN
  Administered 2021-03-18: 10 mg via INTRAVENOUS

## 2021-04-08 ENCOUNTER — Other Ambulatory Visit: Payer: Self-pay

## 2021-04-08 MED ORDER — AMLODIPINE BESYLATE 10 MG PO TABS
10.0000 mg | ORAL_TABLET | Freq: Every day | ORAL | 3 refills | Status: DC
Start: 2021-04-08 — End: 2022-02-25

## 2021-05-18 ENCOUNTER — Institutional Professional Consult (permissible substitution): Payer: Managed Care, Other (non HMO) | Admitting: Pulmonary Disease

## 2021-05-20 ENCOUNTER — Encounter: Payer: Self-pay | Admitting: General Practice

## 2021-06-13 NOTE — Progress Notes (Signed)
Cardiology Office Note:    Date:  06/16/2021   ID:  [name] [name], DOB [birth year], MRN [id number]  PCP:  Pcp, No  Cardiologist:  Little Ishikawahristopher L Talayeh Bruinsma, MD  Electrophysiologist:  None   Referring MD: No ref. provider found   Chief Complaint  Patient presents with   Chest Pain    History of Present Illness:    [name] [name] is a 41 y.o. female with a hx of hypertension who presents for follow-up.  She was seen in the ED on 02/02/2021 with chest pain.  EKG, troponin unremarkable.  Noted to be hypertensive and discharged on amlodipine 10 mg daily.    Reports diagnosed with hypertension in 2018 after birth of last child. Stopped taking meds in 2018.  Reports she started having chest pain in August 2022.  Has been occurring pretty much daily.  Happens randomly and can last for hours.  Reports pain across her whole chest.  Describes as pressure.  Also reports having dyspnea.  She does not exercise.  She denies any lightheadedness or syncope.  Also having episodes where she feels like her heart is racing.  She reports swelling in her ankles and feet.  She quit smoking few months ago, smoked for 3 years.  No history of heart disease in her immediate family.  She started taking amlodipine 10 mg daily after her ED visit but ran out.  Coronary CTA on 03/18/2021 showed normal coronary arteries.  Zio patch x3 days on 03/29/2021 showed no significant abnormalities.  Since last clinic visit, she reports that she is doing okay.  Reports occasional chest pain and dyspnea.  Also having some lower extremity edema.  Reports palpitations have improved.  Denies any lightheadedness or syncope.   Past Medical History:  Diagnosis Date   Complication of anesthesia    difficulty breathing after surgery    Hypertension     Past Surgical History:  Procedure Laterality Date   CHOLECYSTECTOMY     PILONIDAL CYST EXCISION      Current Medications: Current Meds  Medication Sig   acetaminophen (TYLENOL) 325  MG tablet Take 325 mg by mouth every 6 (six) hours as needed for moderate pain.   amLODipine (NORVASC) 10 MG tablet Take 1 tablet (10 mg total) by mouth daily.   amphetamine-dextroamphetamine (ADDERALL) 30 MG tablet Take 1 tablet by mouth 2 (two) times daily.   aspirin 81 MG chewable tablet Chew 81 mg by mouth in the morning and at bedtime.   Garlic (GARLIQUE PO) Take 1 tablet by mouth daily.   losartan (COZAAR) 25 MG tablet Take 1 tablet (25 mg total) by mouth daily.   NON FORMULARY Super beets-2 tablets daily     Allergies:   Patient has no known allergies.   Social History   Socioeconomic History   Marital status: Single    Spouse name: Not on file   Number of children: Not on file   Years of education: Not on file   Highest education level: Not on file  Occupational History   Not on file  Tobacco Use   Smoking status: Former   Smokeless tobacco: Never  Vaping Use   Vaping Use: Never used  Substance and Sexual Activity   Alcohol use: No   Drug use: No   Sexual activity: Yes    Birth control/protection: None  Other Topics Concern   Not on file  Social History Narrative   Not on file   Social Determinants of Health  Financial Resource Strain: Not on file  Food Insecurity: Not on file  Transportation Needs: Not on file  Physical Activity: Not on file  Stress: Not on file  Social Connections: Not on file     Family History: The patient's family history includes Cancer in an other family member; Diabetes in an other family member; Hypertension in her father.  ROS:   Please see the history of present illness.     All other systems reviewed and are negative.  EKGs/Labs/Other Studies Reviewed:    The following studies were reviewed today:   EKG:  EKG is not ordered today.  The ekg ordered at prior clinic visit demonstrates sinus tachycardia, rate 105, no ST abnormalities  Recent Labs: 02/02/2021: BUN 13; Creatinine, Ser 0.88; Hemoglobin 12.9; Platelets 304;  Potassium 3.8; Sodium 135  Recent Lipid Panel    Component Value Date/Time   TRIG 65 12/26/2006 0545    Physical Exam:    VS:  BP (!) 152/90    Pulse (!) 109    Ht 5\' 7"  (1.702 m)    Wt 262 lb (118.8 kg)    SpO2 96%    BMI 41.04 kg/m     Wt Readings from Last 3 Encounters:  06/16/21 262 lb (118.8 kg)  03/10/21 252 lb 3.2 oz (114.4 kg)  02/02/21 246 lb 14.6 oz (112 kg)     GEN:  Well nourished, well developed in no acute distress HEENT: Normal NECK: No JVD; No carotid bruits LYMPHATICS: No lymphadenopathy CARDIAC: RRR, 2 out of 6 systolic murmur RESPIRATORY:  Clear to auscultation without rales, wheezing or rhonchi  ABDOMEN: Soft, non-tender, non-distended MUSCULOSKELETAL:  No edema; No deformity  SKIN: Warm and dry NEUROLOGIC:  Alert and oriented x 3 PSYCHIATRIC:  Normal affect   ASSESSMENT:    1. Hypertension, unspecified type   2. Medication management   3. Heart murmur   4. Chest pain, unspecified type   5. Palpitations   6. Daytime somnolence     PLAN:    Chest pain: Atypical in description but despite age she does have CAD risk factors (hypertension, tobacco use).  Coronary CTA on 03/18/2021 showed normal coronary arteries.  No further cardiac work-up recommended.  Palpitations: Zio patch x3 days on 03/29/2021 showed no significant abnormalities.  Heart murmur: 2 out of 6 systolic murmur.  Will check echocardiogram  Hypertension: On amlodipine 10 mg daily.  BP remains elevated. Will add losartan 25 mg daily.  Check BMET in 1 week.  Recommend checking BP daily for next 2 weeks and calling with results.  Suspect untreated OSA contributing.  Notably, has had hypokalemia on prior labs.  Will check renin/aldosterone to evaluate for hyperaldosteronism  Suspected OSA: Reports snoring, daytime somnolence.  Referred for sleep study  RTC in 6 months   Medication Adjustments/Labs and Tests Ordered: Current medicines are reviewed at length with the patient today.   Concerns regarding medicines are outlined above.  Orders Placed This Encounter  Procedures   Basic metabolic panel   Aldosterone + renin activity w/ ratio   ECHOCARDIOGRAM COMPLETE   Meds ordered this encounter  Medications   losartan (COZAAR) 25 MG tablet    Sig: Take 1 tablet (25 mg total) by mouth daily.    Dispense:  90 tablet    Refill:  3    Patient Instructions  Medication Instructions:  Your physician has recommended you make the following change in your medication:  START Losartan 25 mg tablets daily  *If you  need a refill on your cardiac medications before your next appointment, please call your pharmacy*   Lab Work: BMET- In ONE WEEK If you have labs (blood work) drawn today and your tests are completely normal, you will receive your results only by: MyChart Message (if you have MyChart) OR A paper copy in the mail If you have any lab test that is abnormal or we need to change your treatment, we will call you to review the results.   Testing/Procedures: Your physician has requested that you have an echocardiogram. Echocardiography is a painless test that uses sound waves to create images of your heart. It provides your doctor with information about the size and shape of your heart and how well your hearts chambers and valves are working. This procedure takes approximately one hour. There are no restrictions for this procedure.    Follow-Up: At [facility], you and your health needs are our priority.  As part of our continuing mission to provide you with exceptional heart care, we have created designated Provider Care Teams.  These Care Teams include your primary Cardiologist (physician) and Advanced Practice Providers (APPs -  Physician Assistants and Nurse Practitioners) who all work together to provide you with the care you need, when you need it.  We recommend signing up for the patient portal called "MyChart".  Sign up information is provided on this After  Visit Summary.  MyChart is used to connect with patients for Virtual Visits (Telemedicine).  Patients are able to view lab/test results, encounter notes, upcoming appointments, etc.  Non-urgent messages can be sent to your provider as well.   To learn more about what you can do with MyChart, go to [email or web address].    Your next appointment:   6 month(s)  The format for your next appointment:   In Person  Provider:   Epifanio Lesches, MD     Other Instructions Please track blood pressure for 2 weeks and update our office.     Signed, Little Ishikawa, MD  06/16/2021 12:21 PM    [facility]

## 2021-06-16 ENCOUNTER — Ambulatory Visit (INDEPENDENT_AMBULATORY_CARE_PROVIDER_SITE_OTHER): Payer: Managed Care, Other (non HMO) | Admitting: Cardiology

## 2021-06-16 ENCOUNTER — Other Ambulatory Visit: Payer: Self-pay

## 2021-06-16 ENCOUNTER — Encounter: Payer: Self-pay | Admitting: Cardiology

## 2021-06-16 VITALS — BP 152/90 | HR 109 | Ht 67.0 in | Wt 262.0 lb

## 2021-06-16 DIAGNOSIS — R002 Palpitations: Secondary | ICD-10-CM

## 2021-06-16 DIAGNOSIS — R011 Cardiac murmur, unspecified: Secondary | ICD-10-CM

## 2021-06-16 DIAGNOSIS — I10 Essential (primary) hypertension: Secondary | ICD-10-CM | POA: Diagnosis not present

## 2021-06-16 DIAGNOSIS — R400 Somnolence: Secondary | ICD-10-CM

## 2021-06-16 DIAGNOSIS — R079 Chest pain, unspecified: Secondary | ICD-10-CM

## 2021-06-16 DIAGNOSIS — Z79899 Other long term (current) drug therapy: Secondary | ICD-10-CM | POA: Diagnosis not present

## 2021-06-16 MED ORDER — LOSARTAN POTASSIUM 25 MG PO TABS: 25.0000 mg | ORAL_TABLET | Freq: Every day | ORAL | 3 refills | Status: DC

## 2021-06-16 NOTE — Patient Instructions (Signed)
Medication Instructions:  Your physician has recommended you make the following change in your medication:  START Losartan 25 mg tablets daily  *If you need a refill on your cardiac medications before your next appointment, please call your pharmacy*   Lab Work: BM[facility]- In [place] If you have labs (blood work) drawn today and your tests are completely normal, you will receive your results only by: [place] (if you have MyChart) OR A paper copy in the mail If you have any lab test that is abnormal or we need to change your treatment, we will call you to review the results.   Testing/Procedures: Your physician has requested that you have an echocardiogram. Echocardiography is a painless test that uses sound waves to create images of your heart. It provides your doctor with information about the size and shape of your heart and how well your hearts chambers and valves are working. This procedure takes approximately one hour. There are no restrictions for this procedure.    Follow-Up: At [facility], you and your health needs are our priority.  As part of our continuing mission to provide you with exceptional heart care, we have created designated Provider Care Teams.  These Care Teams include your primary Cardiologist (physician) and Advanced Practice Providers (APPs -  Physician Assistants and Nurse Practitioners) who all work together to provide you with the care you need, when you need it.  We recommend signing up for the patient portal called "MyChart".  Sign up information is provided on this After Visit Summary.  MyChart is used to connect with patients for Virtual Visits (Telemedicine).  Patients are able to view lab/test results, encounter notes, upcoming appointments, etc.  Non-urgent messages can be sent to your provider as well.   To learn more about what you can do with MyChart, go to [email or web address].    Your next appointment:   6 month(s)  The format for your  next appointment:   In Person  Provider:   Oswaldo Milian, MD     Other Instructions Please track blood pressure for 2 weeks and update our office.

## 2021-06-30 ENCOUNTER — Ambulatory Visit (INDEPENDENT_AMBULATORY_CARE_PROVIDER_SITE_OTHER): Payer: Managed Care, Other (non HMO) | Admitting: Pulmonary Disease

## 2021-06-30 ENCOUNTER — Other Ambulatory Visit: Payer: Self-pay

## 2021-06-30 ENCOUNTER — Encounter: Payer: Self-pay | Admitting: Pulmonary Disease

## 2021-06-30 VITALS — BP 138/90 | HR 84 | Temp 98.3°F | Ht 67.0 in | Wt 265.0 lb

## 2021-06-30 DIAGNOSIS — G4730 Sleep apnea, unspecified: Secondary | ICD-10-CM

## 2021-06-30 NOTE — Progress Notes (Signed)
Subjective:    Patient ID: [name] [name], female    DOB: 07-Nov-1980, 41 y.o.   MRN: [id number]  HPI  41 year old from [place] presents for evaluation of sleep disordered breathing, referred by cardiology  She presented to the ED 02/02/2021 with substernal nonradiating chest pain, EKG was normal, cardiac enzymes were normal.  She was subsequently evaluated by cardiology.  CT coronaries showed normal coronary artery. Zio patch for 3 days showed no significant arrhythmias.  Follow-up cardiology consultation was reviewed, she had new onset hypertension and was started on amlodipine in October, losartan was added in January and she was referred to rule out OSA as a cause of hypertension.  Epworth Sleepiness Scale is6 Bedtime is between 10 PM and midnight, on weekends she goes to bed as late as 3 AM, TV stays on through the night, sleep latency can be 30 minutes, she sleeps on her stomach with 1 pillow, reports 3 or more nocturnal awakenings including nocturia and is out of bed by 7 AM.  She hates the snooze button a few times before she wakes up, denies dryness of mouth or headaches in the morning. On weekends wake up time can be as late as 8 or 9 AM. 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  She lives with her boyfriend and 2 children aged 46 and 94.  No bed partner history is available today.  Snoring has been noted by her boyfriend, no witnessed apneas  Significant tests/ events reviewed  Coronary CTA on 03/18/2021 showed normal coronary arteries.  Minimal scarring right middle lobe and lingula  Zio patch x3 days on 03/29/2021 showed no significant abnormalities.  Past Medical History:  Diagnosis Date   Complication of anesthesia    difficulty breathing  after surgery    Hypertension     Past Surgical History:  Procedure Laterality Date   CHOLECYSTECTOMY     PILONIDAL CYST EXCISION       No Known Allergies  Social History   Socioeconomic History   Marital status: Single    Spouse name: Not on file   Number of children: Not on file   Years of education: Not on file   Highest education level: Not on file  Occupational History   Not on file  Tobacco Use   Smoking status: Former   Smokeless tobacco: Never  Vaping Use   Vaping Use: Never used  Substance and Sexual Activity   Alcohol use: No   Drug use: No   Sexual activity: Yes    Birth control/protection: None  Other Topics Concern   Not on file  Social History Narrative   Not on file   Social Determinants of Health   Financial Resource Strain: Not on file  Food Insecurity: Not on file  Transportation Needs: Not on file  Physical Activity: Not on file  Stress: Not on file  Social Connections: Not on file  Intimate Partner Violence: Not on file     Family History  Problem Relation Age of Onset   Cancer Other    Diabetes Other    Hypertension Father       Review of Systems Positive for shortness of breath with activity Chest pain Irregular heartbeat Weight gain  Constitutional: negative for anorexia, fevers and  sweats  Eyes: negative for irritation, redness and visual disturbance  Ears, nose, mouth, throat, and face: negative for earaches, epistaxis, nasal congestion and sore throat  Respiratory: negative for cough,  sputum and wheezing  Cardiovascular: negative for lower extremity edema, orthopnea,  and syncope  Gastrointestinal: negative for abdominal pain, constipation, diarrhea, melena, nausea and vomiting  Genitourinary:negative for dysuria, frequency and hematuria  Hematologic/lymphatic: negative for bleeding, easy bruising and lymphadenopathy  Musculoskeletal:negative for arthralgias, muscle weakness and stiff joints  Neurological: negative  for coordination problems, gait problems, headaches and weakness  Endocrine: negative for diabetic symptoms including polydipsia, polyuria and weight loss    Objective:   Physical Exam  Gen. Pleasant, obese, in no distress, normal affect ENT - no pallor,icterus, no post nasal drip, class 2 airway Neck: No JVD, no thyromegaly, no carotid bruits Lungs: no use of accessory muscles, no dullness to percussion, decreased without rales or rhonchi  Cardiovascular: Rhythm regular, heart sounds  normal, no murmurs or gallops, no peripheral edema Abdomen: soft and non-tender, no hepatosplenomegaly, BS normal. Musculoskeletal: No deformities, no cyanosis or clubbing Neuro:  alert, non focal, no tremors        Assessment & Plan:    Snoring - Given excessive daytime somnolence,, nonrefreshing sleep narrow pharyngeal exam& loud snoring, obstructive sleep apnea is possible & an overnight polysomnogram will be scheduled as a home study. The pathophysiology of obstructive sleep apnea , it's cardiovascular consequences & modes of treatment including CPAP were discused with the patient in detail & they evidenced understanding.  New onset hypertension requiring 2 medications is concerning. Pretest probability is intermediate, we will treat if AHI is more than 15 or significant hypoxia.  Dyspnea /chest pain-CT chest was reviewed, no pulmonary cause of chest pain identified

## 2021-06-30 NOTE — Patient Instructions (Signed)
X home sleep test °

## 2021-07-14 ENCOUNTER — Encounter: Payer: Self-pay | Admitting: Cardiology

## 2021-07-14 ENCOUNTER — Ambulatory Visit (HOSPITAL_COMMUNITY)
Admission: RE | Admit: 2021-07-14 | Discharge: 2021-07-14 | Disposition: A | Discharge status: Home / Self Care | Payer: Managed Care, Other (non HMO) | Source: Ambulatory Visit | Attending: Cardiology | Admitting: Cardiology

## 2021-07-14 ENCOUNTER — Other Ambulatory Visit: Payer: Self-pay

## 2021-07-14 DIAGNOSIS — R011 Cardiac murmur, unspecified: Secondary | ICD-10-CM | POA: Insufficient documentation

## 2021-07-14 LAB — ECHOCARDIOGRAM COMPLETE
AR max vel: 1.71 cm2
AV Area VTI: 1.8 cm2
AV Area mean vel: 1.97 cm2
AV Mean grad: 9 mmHg
AV Peak grad: 17.8 mmHg
Ao pk vel: 2.11 m/s
Area-P 1/2: 3.72 cm2
S' Lateral: 3 cm

## 2021-07-14 NOTE — Progress Notes (Signed)
*  PRELIMINARY RESULTS* Echocardiogram 2D Echocardiogram has been performed.  Stacey Drain 07/14/2021, 12:17 PM

## 2021-08-19 ENCOUNTER — Telehealth: Payer: Managed Care, Other (non HMO) | Admitting: Family Medicine

## 2021-08-19 DIAGNOSIS — K0889 Other specified disorders of teeth and supporting structures: Secondary | ICD-10-CM | POA: Diagnosis not present

## 2021-08-19 DIAGNOSIS — R220 Localized swelling, mass and lump, head: Secondary | ICD-10-CM

## 2021-08-19 MED ORDER — AMOXICILLIN-POT CLAVULANATE 875-125 MG PO TABS
1.0000 | ORAL_TABLET | Freq: Two times a day (BID) | ORAL | 0 refills | Status: AC
Start: 2021-08-19 — End: 2021-08-26

## 2021-08-19 MED ORDER — NAPROXEN 500 MG PO TABS: 500.0000 mg | ORAL_TABLET | Freq: Two times a day (BID) | ORAL | 0 refills | Status: AC

## 2021-08-19 MED ORDER — CHLORHEXIDINE GLUCONATE 0.12 % MT SOLN: 15.0000 mL | Freq: Two times a day (BID) | OROMUCOSAL | 0 refills | Status: DC

## 2021-08-19 NOTE — Patient Instructions (Signed)
Dental Pain Dental pain is often a sign that something is wrong with your teeth or gums. It is also something that can occur following dental treatment. If you have dental pain, it is important to contact your dental care provider, especially if the cause of the pain has not been determined. Dental pain may be of varying intensity and can be caused by many things, including: Tooth decay (cavities or caries). Cavities are caused by bacteria that produce acids that irritate the nerve of your tooth, making it sensitive to air and hot or cold temperatures. This eventually causes discomfort or pain. Abscess or infection. Once the bacteria reach the inner part of the tooth (pulp), a bacterial infection (dental abscess) can occur. Pus typically collects at the end of the root of a tooth. Injury. A crack in the tooth. Gum recession exposing the root, and possibly the nerves, of a tooth. Gum (periodontal)disease. Abnormal grinding or clenching. Poor or improper home care. An unknown reason (idiopathic). Your pain may be mild or severe. It may occur when you are: Chewing. Exposed to hot or cold temperatures. Eating or drinking sugary foods or beverages, such as soda or candy. Your pain may be constant, or it may come and go without cause. Follow these instructions at home: The following actions may help to lessen any discomfort that you are feeling before or after getting dental care. Medicines Take over-the-counter and prescription medicines only as told by your dental care provider. If you were prescribed an antibiotic medicine, take it as told by your dental care provider. Do not stop taking the antibiotic even if you start to feel better. Eating and drinking Avoid foods or drinks that cause you pain, such as: Very hot or very cold foods or drinks. Sweet or sugary foods or drinks. Managing pain and swelling  Ice can sometimes be used to reduce pain and swelling, especially if the pain is  following dental treatment. If directed, put ice on the painful area of your face. To do this: Put ice in a plastic bag. Place a towel between your skin and the bag. Leave the ice on for 20 minutes, 2-3 times a day. Remove the ice if your skin turns bright red. This is very important. If you cannot feel pain, heat, or cold, you have a greater risk of damage to the area. Brushing your teeth To keep your mouth and gums healthy, brush your teeth twice a day using a fluoride toothpaste. Use a toothpaste made for sensitive teeth as directed by your dental care provider, especially if the root is exposed. Always brush your teeth with a soft-bristled toothbrush. This will help prevent irritation to your gums. General instructions Floss at least once a day. Do not apply heat to the outside of the face. Gargle with a mixture of salt and water 3-4 times a day or as needed. To make salt water, completely dissolve -1 tsp (3-6 g) of salt in 1 cup (237 mL) of warm water. Keep all follow-up visits. This is important. Contact a dental care provider if: You have any unexplained dental pain. Your pain is not controlled with medicines. Your symptoms get worse. You have new symptoms. Get help right away if: You are unable to open your mouth. You are having trouble breathing or swallowing. You have a fever. You notice that your face, neck, or jaw is swollen. These symptoms may represent a serious problem that is an emergency. Do not wait to see if the symptoms will go   away. Get medical help right away. Call your local emergency services (911 in the [place] Do not drive yourself to the hospital. Summary Dental pain may be caused by many things, including tooth decay and infection. Your pain may be mild or severe. Take over-the-counter and prescription medicines only as told by your dental care provider. Watch your dental pain for any changes. Let your dental care provider know if your symptoms get  worse. This information is not intended to replace advice given to you by your health care provider. Make sure you discuss any questions you have with your health care provider. Document Revised: 02/19/2020 Document Reviewed: 02/19/2020 Elsevier Patient Education  2022 [employer] Inc[employer]  

## 2021-08-19 NOTE — Progress Notes (Signed)
?Virtual Visit Consent  ? ?[name] [name], you are scheduled for a virtual visit with a [facility] provider today.   ?  ?Just as with appointments in the office, your consent must be obtained to participate.  Your consent will be active for this visit and any virtual visit you may have with one of our providers in the next 365 days.   ?  ?If you have a MyChart account, a copy of this consent can be sent to you electronically.  All virtual visits are billed to your insurance company just like a traditional visit in the office.   ? ?As this is a virtual visit, video technology does not allow for your provider to perform a traditional examination.  This may limit your provider's ability to fully assess your condition.  If your provider identifies any concerns that need to be evaluated in person or the need to arrange testing (such as labs, EKG, etc.), we will make arrangements to do so.   ?  ?Although advances in technology are sophisticated, we cannot ensure that it will always work on either your end or our end.  If the connection with a video visit is poor, the visit may have to be switched to a telephone visit.  With either a video or telephone visit, we are not always able to ensure that we have a secure connection.    ? ?I need to obtain your verbal consent now.   Are you willing to proceed with your visit today?  ?  ?[name] [name] has provided verbal consent on 08/19/2021 for a virtual visit (video or telephone). ?  ?[name] Finner, NP  ? ?Date: 08/19/2021 8:35 AM ? ? ?Virtual Visit via Video Note  ? ?[name] [name], connected with  [name] [name]  ([id number], Jun 11, 1980) on 08/19/21 at  8:30 AM EDT by a video-enabled telemedicine application and verified that I am speaking with the correct person using two identifiers. ? ?Location: ?Patient: Virtual Visit Location Patient: Home ?Provider: Virtual Visit Location Provider: Home Office ?  ?I discussed the limitations of evaluation and management by telemedicine  and the availability of in person appointments. The patient expressed understanding and agreed to proceed.   ? ?History of Present Illness: ?[name] [name] is a 41 y.o. who identifies as a female who was assigned female at birth, and is being seen today for tooth and gum pain. Similar to her dental pain she had in Dec, but reports it is not as bad. Has swelling of gums and chewing pain, but no swallowing trouble. Started 2 days ago, getting worse. Denies trauma or injury. Never followed up with dentist, does not see dentist regularly. Reports improvement after completing meds from ED in Dec.  Has tried OTC pain relievers with out much relief. Denies fevers, chills, dysphagia, odynophagia, oral or neck swelling, nausea, vomiting, chest pain, SOB ? ? ?Problems:  ?Patient Active Problem List  ? Diagnosis Date Noted  ? Gestational hypertension 08/01/2016  ? Gestational HTN 08/01/2016  ? Uterine size date discrepancy pregnancy, third trimester 07/28/2016  ? Hemorrhoids during pregnancy, antepartum 06/27/2016  ? Trichomonal vaginitis during pregnancy in third trimester 06/02/2016  ? Supervision of low-risk pregnancy 05/19/2016  ? Late prenatal care affecting pregnancy, antepartum 05/19/2016  ? Obesity affecting pregnancy, antepartum 05/19/2016  ?  ?Allergies: No Known Allergies ?Medications:  ?Current Outpatient Medications:  ?  acetaminophen (TYLENOL) 325 MG tablet, Take 325 mg by mouth every 6 (six) hours as needed  for moderate pain., Disp: , Rfl:  ?  amLODipine (NORVASC) 10 MG tablet, Take 1 tablet (10 mg total) by mouth daily., Disp: 90 tablet, Rfl: 3 ?  amphetamine-dextroamphetamine (ADDERALL) 30 MG tablet, Take 1 tablet by mouth 2 (two) times daily., Disp: , Rfl:  ?  aspirin 81 MG chewable tablet, Chew 81 mg by mouth in the morning and at bedtime., Disp: , Rfl:  ?  Garlic (GARLIQUE PO), Take 1 tablet by mouth daily., Disp: , Rfl:  ?  losartan (COZAAR) 25 MG tablet, Take 1 tablet (25 mg total) by mouth daily., Disp:  90 tablet, Rfl: 3 ?  NON FORMULARY, Super beets-2 tablets daily, Disp: , Rfl:  ? ?Observations/Objective: ?Patient is well-developed, well-nourished in no acute distress.  ?Resting comfortably  at home.  ?Head is normocephalic, atraumatic.  ?No labored breathing.  ?Speech is clear and coherent with logical content.  ?Patient is alert and oriented at baseline.  ?Gums pink and slightly swollen ? ?Assessment and Plan: ?1. Pain, dental ? ?- amoxicillin-clavulanate (AUGMENTIN) 875-125 MG tablet; Take 1 tablet by mouth 2 (two) times daily for 7 days.  Dispense: 14 tablet; Refill: 0 ?- chlorhexidine (PERIDEX) 0.12 % solution; Use as directed 15 mLs in the mouth or throat 2 (two) times daily.  Dispense: 120 mL; Refill: 0 ?- naproxen (NAPROSYN) 500 MG tablet; Take 1 tablet (500 mg total) by mouth 2 (two) times daily with a meal for 7 days.  Dispense: 14 tablet; Refill: 0 ? ?2. Swelling of gums ? ?- amoxicillin-clavulanate (AUGMENTIN) 875-125 MG tablet; Take 1 tablet by mouth 2 (two) times daily for 7 days.  Dispense: 14 tablet; Refill: 0 ?- chlorhexidine (PERIDEX) 0.12 % solution; Use as directed 15 mLs in the mouth or throat 2 (two) times daily.  Dispense: 120 mL; Refill: 0 ?- naproxen (NAPROSYN) 500 MG tablet; Take 1 tablet (500 mg total) by mouth 2 (two) times daily with a meal for 7 days.  Dispense: 14 tablet; Refill: 0 ? ?Naproxen prescribed.  Use as directed for pain relief ?Chlorhexidine and Augmentin were prescribed ?Recommend soft diet until evaluated by dentist- encouraged this  ?Maintain oral hygiene care ?Follow up with dentist as soon as possible for further evaluation and treatment  ?Return or go to the ED if you have any new or worsening symptoms such as fever, chills, difficulty swallowing, painful swallowing, oral or neck swelling, nausea, vomiting, chest pain, SOB, etc... ? ?Patient acknowledged agreement and understanding of the plan.  ? ? ?Follow Up Instructions: ?I discussed the assessment and treatment  plan with the patient. The patient was provided an opportunity to ask questions and all were answered. The patient agreed with the plan and demonstrated an understanding of the instructions.  A copy of instructions were sent to the patient via MyChart unless otherwise noted below.  ? ? ? ?The patient was advised to call back or seek an in-person evaluation if the symptoms worsen or if the condition fails to improve as anticipated. ? ?Time:  ?I spent 10 minutes with the patient via telehealth technology discussing the above problems/concerns.   ? ?[name] Finner, NP ? ?

## 2021-12-26 NOTE — Progress Notes (Unsigned)
Cardiology Office Note:    Date:  12/28/2021   ID:  [name] [name], DOB 1980/10/17, MRN [id number]  PCP:  Pcp, No  Cardiologist:  Little Ishikawa, MD  Electrophysiologist:  None   Referring MD: No ref. provider found   Chief Complaint  Patient presents with   Hypertension    History of Present Illness:    [name] [name] is a 41 y.o. female with a hx of hypertension who presents for follow-up.  She was seen in the ED on 02/02/2021 with chest pain.  EKG, troponin unremarkable.  Noted to be hypertensive and discharged on amlodipine 10 mg daily.    Reports diagnosed with hypertension in 2018 after birth of last child. Stopped taking meds in 2018.  Reports she started having chest pain in August 2022.  Has been occurring pretty much daily.  Happens randomly and can last for hours.  Reports pain across her whole chest.  Describes as pressure.  Also reports having dyspnea.  She does not exercise.  She denies any lightheadedness or syncope.  Also having episodes where she feels like her heart is racing.  She reports swelling in her ankles and feet.  She quit smoking few months ago, smoked for 3 years.  No history of heart disease in her immediate family.  She started taking amlodipine 10 mg daily after her ED visit but ran out.  Coronary CTA on 03/18/2021 showed normal coronary arteries.  Zio patch x3 days on 03/29/2021 showed no significant abnormalities.  Echocardiogram 07/14/2021 showed EF 60 to 65%, normal diastolic function, moderate LVH, normal RV function, mild AI.  Since last clinic visit, she reports that she has been doing well.  Denies any chest pain, dyspnea, lightheadedness, syncope.  Reports occasional lower extremity edema.   Past Medical History:  Diagnosis Date   Complication of anesthesia    difficulty breathing after surgery    Hypertension     Past Surgical History:  Procedure Laterality Date   CHOLECYSTECTOMY     PILONIDAL CYST EXCISION      Current  Medications: Current Meds  Medication Sig   amLODipine (NORVASC) 10 MG tablet Take 1 tablet (10 mg total) by mouth daily.     Allergies:   Patient has no known allergies.   Social History   Socioeconomic History   Marital status: Single    Spouse name: Not on file   Number of children: Not on file   Years of education: Not on file   Highest education level: Not on file  Occupational History   Not on file  Tobacco Use   Smoking status: Former   Smokeless tobacco: Never  Vaping Use   Vaping Use: Never used  Substance and Sexual Activity   Alcohol use: No   Drug use: No   Sexual activity: Yes    Birth control/protection: None  Other Topics Concern   Not on file  Social History Narrative   Not on file   Social Determinants of Health   Financial Resource Strain: Not on file  Food Insecurity: Not on file  Transportation Needs: Not on file  Physical Activity: Not on file  Stress: Not on file  Social Connections: Not on file     Family History: The patient's family history includes Cancer in an other family member; Diabetes in an other family member; Hypertension in her father.  ROS:   Please see the history of present illness.     All other systems reviewed and are  negative.  EKGs/Labs/Other Studies Reviewed:    The following studies were reviewed today:   EKG:  EKG is not ordered today.  The ekg ordered at prior clinic visit demonstrates sinus tachycardia, rate 105, no ST abnormalities  Recent Labs: 02/02/2021: BUN 13; Creatinine, Ser 0.88; Hemoglobin 12.9; Platelets 304; Potassium 3.8; Sodium 135  Recent Lipid Panel    Component Value Date/Time   TRIG 65 12/26/2006 0545    Physical Exam:    VS:  BP 128/88   Pulse 94   Ht 5\' 7"  (1.702 m)   Wt 273 lb (123.8 kg)   SpO2 98%   BMI 42.76 kg/m     Wt Readings from Last 3 Encounters:  12/28/21 273 lb (123.8 kg)  06/30/21 265 lb (120.2 kg)  06/16/21 262 lb (118.8 kg)     GEN:  Well nourished, well  developed in no acute distress HEENT: Normal NECK: No JVD; No carotid bruits LYMPHATICS: No lymphadenopathy CARDIAC: RRR, 2 out of 6 systolic murmur RESPIRATORY:  Clear to auscultation without rales, wheezing or rhonchi  ABDOMEN: Soft, non-tender, non-distended MUSCULOSKELETAL:  No edema; No deformity  SKIN: Warm and dry NEUROLOGIC:  Alert and oriented x 3 PSYCHIATRIC:  Normal affect   ASSESSMENT:    1. Hypertension, unspecified type   2. Chest pain, unspecified type   3. Class 3 severe obesity with body mass index (BMI) of 40.0 to 44.9 in adult, unspecified obesity type, unspecified whether serious comorbidity present [facility])      PLAN:    Chest pain: Atypical in description but despite age she does have CAD risk factors (hypertension, tobacco use).  Coronary CTA on 03/18/2021 showed normal coronary arteries.  Echocardiogram 07/14/2021 showed EF 60 to 65%, normal diastolic function, moderate LVH, normal RV function, mild AI.  She reports chest pain has resolved.  No further cardiac work-up recommended.  Palpitations: Zio patch x3 days on 03/29/2021 showed no significant abnormalities.  Hypertension: On amlodipine 10 mg daily and losartan 25 mg daily.  Appears controlled.  Needs repeat BMET since started losartan.  Suspect untreated OSA contributing.  Notably, has had hypokalemia on prior labs.  Will check renin/aldosterone to evaluate for hyperaldosteronism  Suspected OSA: Reports snoring, daytime somnolence.  Referred for sleep study, saw Dr 03/31/2021.  Morbid obesity: Body mass index is 42.76 kg/m.  Refer to pharmacy clinic to evaluate for [facility]  RTC in 1 year   Medication Adjustments/Labs and Tests Ordered: Current medicines are reviewed at length with the patient today.  Concerns regarding medicines are outlined above.  Orders Placed This Encounter  Procedures   HgB A1c   AMB Referral to [facility]   No orders of the defined types were placed in this  encounter.   Patient Instructions  Medication Instructions:  Your physician recommends that you continue on your current medications as directed. Please refer to the Current Medication list given to you today.   Labwork: BMET,A1C, Aldosterone + renin activity  Testing/Procedures: None today  Follow-Up: 1 year    Any Other Special Instructions Will Be Listed Below (If Applicable).    You have been referred to weight loss management with pharmacist in [place].They will call you to schedule appointment.   If you need a refill on your cardiac medications before your next appointment, please call your pharmacy.    Signed, [place], MD  12/28/2021 1:53 PM    [facility]

## 2021-12-28 ENCOUNTER — Encounter: Payer: Self-pay | Admitting: Cardiology

## 2021-12-28 ENCOUNTER — Other Ambulatory Visit (HOSPITAL_COMMUNITY)
Admission: RE | Admit: 2021-12-28 | Discharge: 2021-12-28 | Disposition: A | Discharge status: Home / Self Care | Payer: 59 | Source: Ambulatory Visit | Attending: Cardiology | Admitting: Cardiology

## 2021-12-28 ENCOUNTER — Ambulatory Visit (INDEPENDENT_AMBULATORY_CARE_PROVIDER_SITE_OTHER): Payer: 59 | Admitting: Cardiology

## 2021-12-28 VITALS — BP 128/88 | HR 94 | Ht 67.0 in | Wt 273.0 lb

## 2021-12-28 DIAGNOSIS — Z6841 Body Mass Index (BMI) 40.0 and over, adult: Secondary | ICD-10-CM | POA: Insufficient documentation

## 2021-12-28 DIAGNOSIS — I10 Essential (primary) hypertension: Secondary | ICD-10-CM | POA: Diagnosis not present

## 2021-12-28 DIAGNOSIS — R079 Chest pain, unspecified: Secondary | ICD-10-CM | POA: Diagnosis not present

## 2021-12-28 DIAGNOSIS — Z79899 Other long term (current) drug therapy: Secondary | ICD-10-CM | POA: Insufficient documentation

## 2021-12-28 LAB — HEMOGLOBIN A1C
Hgb A1c MFr Bld: 5.9 % — ABNORMAL HIGH (ref 4.8–5.6)
Mean Plasma Glucose: 122.63 mg/dL

## 2021-12-28 LAB — BASIC METABOLIC PANEL
Anion gap: 7 (ref 5–15)
BUN: 13 mg/dL (ref 6–20)
CO2: 24 mmol/L (ref 22–32)
Calcium: 9.4 mg/dL (ref 8.9–10.3)
Chloride: 107 mmol/L (ref 98–111)
Creatinine, Ser: 0.82 mg/dL (ref 0.44–1.00)
GFR, Estimated: >60 mL/min (ref >60)
Glucose, Bld: 100 mg/dL — ABNORMAL HIGH (ref 70–99)
Potassium: 3.8 mmol/L (ref 3.5–5.1)
Sodium: 138 mmol/L (ref 135–145)

## 2021-12-28 NOTE — Patient Instructions (Addendum)
Medication Instructions:  Your physician recommends that you continue on your current medications as directed. Please refer to the Current Medication list given to you today.   Labwork: BMET,A1C, Aldosterone + renin activity  Testing/Procedures: None today  Follow-Up: 1 year    Any Other Special Instructions Will Be Listed Below (If Applicable).    You have been referred to weight loss management with pharmacist in [place].They will call you to schedule appointment.   If you need a refill on your cardiac medications before your next appointment, please call your pharmacy.

## 2021-12-31 LAB — ALDOSTERONE + RENIN ACTIVITY W/ RATIO
ALDO / PRA Ratio: <1.7 (ref 0.0–30.0)
Aldosterone: <1 ng/dL (ref 0.0–30.0)
PRA LC/MS/MS: 0.572 ng/mL/hr (ref 0.167–5.380)

## 2022-01-17 ENCOUNTER — Encounter: Payer: Self-pay | Admitting: *Deleted

## 2022-02-03 ENCOUNTER — Encounter: Payer: Self-pay | Admitting: Cardiology

## 2022-02-25 ENCOUNTER — Other Ambulatory Visit: Payer: Self-pay

## 2022-02-25 MED ORDER — LOSARTAN POTASSIUM 25 MG PO TABS: 25.0000 mg | ORAL_TABLET | Freq: Every day | ORAL | 3 refills | Status: DC

## 2022-02-25 MED ORDER — AMLODIPINE BESYLATE 10 MG PO TABS: 10.0000 mg | ORAL_TABLET | Freq: Every day | ORAL | 3 refills | Status: DC

## 2022-03-03 ENCOUNTER — Encounter: Payer: Self-pay | Admitting: Pulmonary Disease

## 2022-05-19 ENCOUNTER — Other Ambulatory Visit: Payer: Self-pay

## 2022-05-19 MED ORDER — LOSARTAN POTASSIUM 25 MG PO TABS: 25.0000 mg | ORAL_TABLET | Freq: Every day | ORAL | 3 refills | Status: DC

## 2022-06-21 ENCOUNTER — Telehealth: Payer: Self-pay | Admitting: Cardiology

## 2022-06-21 MED ORDER — AMLODIPINE BESYLATE 10 MG PO TABS: 10.0000 mg | ORAL_TABLET | Freq: Every day | ORAL | 3 refills | Status: DC

## 2022-06-21 NOTE — Telephone Encounter (Signed)
Patient stated she did not take her amLODipine (NORVASC) 10 MG tablet  and losartan (COZAAR) 25 MG tablet today as she is out of the amlodipine and it is her understanding that they need to be taken together.  Patient wants to know if she should still take the losartan or wait until she gets amlodipine.

## 2022-06-21 NOTE — Telephone Encounter (Signed)
Patient requested amlodipine refill be sent to [facility] in [place]. (Done). She was just leaving work and could not get a BP; however, she said her BP/P have been "good".

## 2022-06-21 NOTE — Telephone Encounter (Signed)
*  STAT* If patient is at the pharmacy, call can be transferred to refill team.   1. Which medications need to be refilled? (please list name of each medication and dose if known)   amLODipine (NORVASC) 10 MG tablet   2. Which pharmacy/location (including street and city if local pharmacy) is medication to be sent to?  [facility] [place], [address]   3. Do they need a 30 day or 90 day supply?   90 day  Patient stated she is completely out of this medication.

## 2022-06-22 ENCOUNTER — Other Ambulatory Visit: Payer: Self-pay

## 2022-06-22 MED ORDER — AMLODIPINE BESYLATE 10 MG PO TABS: 10.0000 mg | ORAL_TABLET | Freq: Every day | ORAL | 2 refills | Status: DC

## 2022-06-23 ENCOUNTER — Other Ambulatory Visit: Payer: Self-pay

## 2022-06-23 MED ORDER — AMLODIPINE BESYLATE 10 MG PO TABS: 10.0000 mg | ORAL_TABLET | Freq: Every day | ORAL | 2 refills | Status: DC

## 2022-09-16 ENCOUNTER — Ambulatory Visit
Admission: EM | Admit: 2022-09-16 | Discharge: 2022-09-16 | Disposition: A | Discharge status: Home / Self Care | Payer: 59 | Attending: Physician Assistant | Admitting: Physician Assistant

## 2022-09-16 ENCOUNTER — Encounter: Payer: Self-pay | Admitting: Emergency Medicine

## 2022-09-16 DIAGNOSIS — H109 Unspecified conjunctivitis: Secondary | ICD-10-CM

## 2022-09-16 DIAGNOSIS — J039 Acute tonsillitis, unspecified: Secondary | ICD-10-CM

## 2022-09-16 LAB — POCT RAPID STREP A (OFFICE): Rapid Strep A Screen: NEGATIVE

## 2022-09-16 LAB — POCT MONO SCREEN (KUC): Mono, POC: NEGATIVE

## 2022-09-16 MED ORDER — AMOXICILLIN-POT CLAVULANATE 400-57 MG/5ML PO SUSR: 875.0000 mg | Freq: Two times a day (BID) | ORAL | 0 refills | Status: AC

## 2022-09-16 MED ORDER — LIDOCAINE VISCOUS HCL 2 % MT SOLN: 15.0000 mL | Freq: Four times a day (QID) | OROMUCOSAL | 0 refills | Status: DC | PRN

## 2022-09-16 MED ORDER — POLYMYXIN B-TRIMETHOPRIM 10000-0.1 UNIT/ML-% OP SOLN: 1.0000 [drp] | Freq: Four times a day (QID) | OPHTHALMIC | 0 refills | Status: DC

## 2022-09-16 NOTE — ED Provider Notes (Signed)
[facility]    CSN: [id number] Arrival date & time: 09/16/22  4098      History   Chief Complaint No chief complaint on file.   HPI [name] [name] is a 42 y.o. female.   Patient presents today with a 2-day history of URI symptoms including sore throat.  She also reports that over the past 24 hours she has developed injected conjunctiva with purulent drainage.  She does not wear glasses or contacts.  Denies any exposure to find particulate matter or chemicals.  Denies any known sick contacts.  She has tried TheraFlu without improvement of symptoms.  Denies any recent antibiotics or steroids.  She has had COVID several years ago and is up-to-date on the COVID-19 vaccinations.  She has no concern for pregnancy.  Denies any history of allergies, asthma, COPD, smoking.  She reports that sore throat pain is rated 10 on a 0-10 pain scale, described as sharp, worse with swallowing, no alleviating factors identified.  She is able to swallow but it is incredibly painful that she has had a decreased oral intake since symptoms began.  Denies any muffled voice, swelling of her throat, shortness of breath.    Past Medical History:  Diagnosis Date   Complication of anesthesia    difficulty breathing after surgery    Hypertension     Patient Active Problem List   Diagnosis Date Noted   Gestational hypertension 08/01/2016   Gestational HTN 08/01/2016   Uterine size date discrepancy pregnancy, third trimester 07/28/2016   Hemorrhoids during pregnancy, antepartum 06/27/2016   Trichomonal vaginitis during pregnancy in third trimester 06/02/2016   Supervision of low-risk pregnancy 05/19/2016   Late prenatal care affecting pregnancy, antepartum 05/19/2016   Obesity affecting pregnancy, antepartum 05/19/2016    Past Surgical History:  Procedure Laterality Date   CHOLECYSTECTOMY     PILONIDAL CYST EXCISION      OB History     Gravida  3   Para  2   Term  2   Preterm  0   AB   1   Living  2      SAB  0   IAB  1   Ectopic  0   Multiple  0   Live Births  2            Home Medications    Prior to Admission medications   Medication Sig Start Date End Date Taking? Authorizing Provider  amoxicillin-clavulanate (AUGMENTIN) 400-57 MG/5ML suspension Take 10.9 mLs (875 mg total) by mouth 2 (two) times daily for 10 days. 09/16/22 09/26/22 Yes Rihana Kiddy K, PA-C  lidocaine (XYLOCAINE) 2 % solution Use as directed 15 mLs in the mouth or throat every 6 (six) hours as needed for mouth pain. Swish and spit 09/16/22  Yes Meleena Munroe K, PA-C  trimethoprim-polymyxin b (POLYTRIM) ophthalmic solution Place 1 drop into the left eye every 6 (six) hours. 09/16/22  Yes Aitanna Haubner K, PA-C  amLODipine (NORVASC) 10 MG tablet Take 1 tablet (10 mg total) by mouth daily. 06/23/22   Little Ishikawa, MD  amphetamine-dextroamphetamine (ADDERALL) 30 MG tablet Take 1 tablet by mouth 2 (two) times daily. Patient not taking: Reported on 12/28/2021 12/29/20   [provider]  aspirin 81 MG chewable tablet Chew 81 mg by mouth in the morning and at bedtime. Patient not taking: Reported on 12/28/2021    [provider]  Garlic (GARLIQUE PO) Take 1 tablet by mouth daily. Patient not taking:  Reported on 12/28/2021    [provider]  losartan (COZAAR) 25 MG tablet Take 1 tablet (25 mg total) by mouth daily. 05/19/22   Little Ishikawa, MD  NON FORMULARY Super beets-2 tablets daily Patient not taking: Reported on 12/28/2021    [provider]    Family History Family History  Problem Relation Age of Onset   Hypertension Father    Cancer Other    Diabetes Other     Social History Social History   Tobacco Use   Smoking status: Former   Smokeless tobacco: Never  Building services engineer Use: Never used  Substance Use Topics   Alcohol use: No   Drug use: No     Allergies   Patient has no known allergies.   Review of Systems Review of  Systems  Constitutional:  Positive for activity change and fever. Negative for appetite change and fatigue.  HENT:  Positive for sore throat and trouble swallowing. Negative for congestion, sinus pressure, sneezing and voice change.   Eyes:  Positive for discharge and redness. Negative for photophobia, pain, itching and visual disturbance.  Respiratory:  Positive for cough. Negative for shortness of breath.   Cardiovascular:  Negative for chest pain.  Gastrointestinal:  Negative for abdominal pain, diarrhea, nausea and vomiting.  Neurological:  Negative for dizziness, light-headedness and headaches.     Physical Exam Triage Vital Signs ED Triage Vitals  Enc Vitals Group     BP 09/16/22 0938 123/81     Pulse Rate 09/16/22 0938 (!) 117     Resp 09/16/22 0938 20     Temp 09/16/22 0938 99.1 F (37.3 C)     Temp Source 09/16/22 0938 Oral     SpO2 09/16/22 0938 94 %     Weight --      Height --      Head Circumference --      Peak Flow --      Pain Score 09/16/22 0939 10     Pain Loc --      Pain Edu? --      Excl. in GC? --    No data found.  Updated Vital Signs BP 123/81 (BP Location: Right Arm)   Pulse (!) 117   Temp 99.1 F (37.3 C) (Oral)   Resp 20   LMP 09/07/2022 (Approximate)   SpO2 94%   Visual Acuity Right Eye Distance:   Left Eye Distance:   Bilateral Distance:    Right Eye Near:   Left Eye Near:    Bilateral Near:     Physical Exam Vitals reviewed.  Constitutional:      General: She is awake. She is not in acute distress.    Appearance: Normal appearance. She is well-developed. She is not ill-appearing.     Comments: Very pleasant female appears stated age in no acute distress sitting comfortably in exam room  HENT:     Head: Normocephalic and atraumatic.     Right Ear: Tympanic membrane, ear canal and external ear normal. Tympanic membrane is not erythematous or bulging.     Left Ear: Ear canal and external ear normal. Tympanic membrane is injected.  Tympanic membrane is not erythematous or bulging.     Nose: Rhinorrhea present. Rhinorrhea is clear.     Right Sinus: No maxillary sinus tenderness or frontal sinus tenderness.     Left Sinus: No maxillary sinus tenderness or frontal sinus tenderness.     Mouth/Throat:  Pharynx: Uvula midline. Posterior oropharyngeal erythema present. No oropharyngeal exudate.     Tonsils: Tonsillar exudate present. No tonsillar abscesses. 2+ on the right. 2+ on the left.  Eyes:     Conjunctiva/sclera:     Right eye: Right conjunctiva is not injected. Exudate present.     Left eye: Left conjunctiva is injected. Exudate present.  Cardiovascular:     Rate and Rhythm: Normal rate and regular rhythm.     Heart sounds: No murmur heard. Pulmonary:     Effort: Pulmonary effort is normal.     Breath sounds: Normal breath sounds. No wheezing, rhonchi or rales.  Musculoskeletal:     Right lower leg: No edema.     Left lower leg: No edema.  Lymphadenopathy:     Head:     Right side of head: No submental, submandibular or tonsillar adenopathy.     Left side of head: No submental, submandibular or tonsillar adenopathy.     Cervical: No cervical adenopathy.  Psychiatric:        Behavior: Behavior is cooperative.      UC Treatments / Results  Labs (all labs ordered are listed, but only abnormal results are displayed) Labs Reviewed  CBC WITH DIFFERENTIAL/PLATELET  COMPREHENSIVE METABOLIC PANEL  POCT RAPID STREP A (OFFICE)  POCT MONO SCREEN ([facility])    EKG   Radiology No results found.  Procedures Procedures (including critical care time)  Medications Ordered in UC Medications - No data to display  Initial Impression / Assessment and Plan / UC Course  I have reviewed the triage vital signs and the nursing notes.  Pertinent labs & imaging results that were available during my care of the patient were reviewed by me and considered in my medical decision making (see chart for details).      Patient is tachycardic but otherwise afebrile and nontoxic.  Strep testing was negative in clinic today.  Mono testing was also negative.  We discussed that symptoms could be related to a viral infection given associated conjunctivitis and tonsillitis, however, given the appearance of her tonsils will cover for secondary bacterial infection.  We discussed that if she is actually positive for mono it is possible she would develop a rash and she should stop the medication to be seen immediately.  CBC and CMP were obtained given her significant symptoms and are pending.  Augmentin was sent to the pharmacy we discussed that if she does not have improvement of symptoms within a few days of starting this medication she should be reevaluated.  We also discussed that if at any point her symptoms worsen and she has difficulty swallowing, swelling of her throat, muffled voice, shortness of breath, high fever she needs to go to the emergency room immediately.  Patient reports that she has already had some difficulty swallowing but has been pushing fluids.  She was given viscous lidocaine to help manage her sore throat symptoms we discussed that she is not to eat or drink immediately after using this medication due to increased risk of choking.  She can gargle with warm salt water and use over-the-counter medications.  She was started on Polytrim to manage bacterial conjunctivitis given exudative drainage on exam.  Recommend she follow-up with either our clinic or primary care first thing next week.  We discussed at length that if she has any worsening or changing symptoms she should go to the emergency room immediately.  Strict return precautions given.  Work excuse note provided.  Final Clinical  Impressions(s) / UC Diagnoses   Final diagnoses:  Exudative tonsillitis  Bacterial conjunctivitis of left eye     Discharge Instructions      We are starting antibiotic to cover for a throat infection.  Please take  Augmentin twice daily for 10 days.  As we discussed, if you are actually positive for mono you can develop a rash.  If that happens stop the medication to be seen immediately.  Gargle with viscous lidocaine to help with the sore throat symptoms.  Do not eat or drink immediately after using this medication (wait 10 to 15 minutes) as it can increase your risk of choking.  Use Polytrim drops 4 times daily to cover for bacterial conjunctivitis.  Make sure that you are drinking plenty of fluids.  As we discussed, if your symptoms or not improving or if anything worsens and you have swelling of your throat, difficulty swallowing, nausea/vomiting, high fever, change in your voice you need to go to the emergency room immediately.     ED Prescriptions     Medication Sig Dispense Auth. Provider   amoxicillin-clavulanate (AUGMENTIN) 400-57 MG/5ML suspension Take 10.9 mLs (875 mg total) by mouth 2 (two) times daily for 10 days. 218 mL Brooklyn Alfredo K, PA-C   trimethoprim-polymyxin b (POLYTRIM) ophthalmic solution Place 1 drop into the left eye every 6 (six) hours. 10 mL Fairley Copher K, PA-C   lidocaine (XYLOCAINE) 2 % solution Use as directed 15 mLs in the mouth or throat every 6 (six) hours as needed for mouth pain. Swish and spit 100 mL Liz Pinho K, PA-C      PDMP not reviewed this encounter.   Jeani Hawking, PA-C 09/16/22 1049

## 2022-09-16 NOTE — ED Triage Notes (Signed)
Sore throat since yesterday.  Left eye redness and drainage since this morning.

## 2022-09-16 NOTE — Discharge Instructions (Signed)
We are starting antibiotic to cover for a throat infection.  Please take Augmentin twice daily for 10 days.  As we discussed, if you are actually positive for mono you can develop a rash.  If that happens stop the medication to be seen immediately.  Gargle with viscous lidocaine to help with the sore throat symptoms.  Do not eat or drink immediately after using this medication (wait 10 to 15 minutes) as it can increase your risk of choking.  Use Polytrim drops 4 times daily to cover for bacterial conjunctivitis.  Make sure that you are drinking plenty of fluids.  As we discussed, if your symptoms or not improving or if anything worsens and you have swelling of your throat, difficulty swallowing, nausea/vomiting, high fever, change in your voice you need to go to the emergency room immediately.

## 2022-09-17 LAB — CBC WITH DIFFERENTIAL/PLATELET
Basophils Absolute: 0.1 10*3/uL (ref 0.0–0.2)
Basos: 0 %
EOS (ABSOLUTE): 0 10*3/uL (ref 0.0–0.4)
Eos: 0 %
Hematocrit: 40.9 % (ref 34.0–46.6)
Hemoglobin: 13.6 g/dL (ref 11.1–15.9)
Immature Grans (Abs): 0 10*3/uL (ref 0.0–0.1)
Immature Granulocytes: 0 %
Lymphocytes Absolute: 0.9 10*3/uL (ref 0.7–3.1)
Lymphs: 4 %
MCH: 26.8 pg (ref 26.6–33.0)
MCHC: 33.3 g/dL (ref 31.5–35.7)
MCV: 81 fL (ref 79–97)
Monocytes Absolute: 1.5 10*3/uL — ABNORMAL HIGH (ref 0.1–0.9)
Monocytes: 7 %
Neutrophils Absolute: 17.8 10*3/uL — ABNORMAL HIGH (ref 1.4–7.0)
Neutrophils: 89 %
Platelets: 297 10*3/uL (ref 150–450)
RBC: 5.08 x10E6/uL (ref 3.77–5.28)
RDW: 14.5 % (ref 11.7–15.4)
WBC: 20.4 10*3/uL (ref 3.4–10.8)

## 2022-09-17 LAB — COMPREHENSIVE METABOLIC PANEL
ALT: 67 IU/L — ABNORMAL HIGH (ref 0–32)
AST: 59 IU/L — ABNORMAL HIGH (ref 0–40)
Albumin/Globulin Ratio: 1.4 (ref 1.2–2.2)
Albumin: 4.3 g/dL (ref 3.9–4.9)
Alkaline Phosphatase: 130 IU/L — ABNORMAL HIGH (ref 44–121)
BUN/Creatinine Ratio: 8 — ABNORMAL LOW (ref 9–23)
BUN: 7 mg/dL (ref 6–24)
Bilirubin Total: 0.2 mg/dL (ref 0.0–1.2)
CO2: 20 mmol/L (ref 20–29)
Calcium: 9.5 mg/dL (ref 8.7–10.2)
Chloride: 100 mmol/L (ref 96–106)
Creatinine, Ser: 0.84 mg/dL (ref 0.57–1.00)
Globulin, Total: 3.1 g/dL (ref 1.5–4.5)
Glucose: 103 mg/dL — ABNORMAL HIGH (ref 70–99)
Potassium: 4.1 mmol/L (ref 3.5–5.2)
Sodium: 139 mmol/L (ref 134–144)
Total Protein: 7.4 g/dL (ref 6.0–8.5)
eGFR: 90 mL/min/{1.73_m2} (ref >59)

## 2022-10-03 ENCOUNTER — Other Ambulatory Visit: Payer: Self-pay | Admitting: Cardiology

## 2023-06-28 ENCOUNTER — Other Ambulatory Visit: Payer: Self-pay | Admitting: Cardiology

## 2023-09-02 ENCOUNTER — Other Ambulatory Visit: Payer: Self-pay | Admitting: Cardiology

## 2023-11-06 ENCOUNTER — Ambulatory Visit: Payer: Self-pay | Admitting: Student in an Organized Health Care Education/Training Program

## 2023-11-06 ENCOUNTER — Encounter: Payer: Self-pay | Admitting: Student in an Organized Health Care Education/Training Program

## 2023-11-06 VITALS — BP 163/101 | HR 101 | Ht 67.0 in | Wt 267.0 lb

## 2023-11-06 DIAGNOSIS — Z6841 Body Mass Index (BMI) 40.0 and over, adult: Secondary | ICD-10-CM

## 2023-11-06 DIAGNOSIS — Z3009 Encounter for other general counseling and advice on contraception: Secondary | ICD-10-CM | POA: Insufficient documentation

## 2023-11-06 DIAGNOSIS — I10 Essential (primary) hypertension: Secondary | ICD-10-CM | POA: Insufficient documentation

## 2023-11-06 LAB — BASIC METABOLIC PANEL WITH GFR
BUN: 9 mg/dL (ref 6–23)
CO2: 21 meq/L (ref 19–32)
Calcium: 9.6 mg/dL (ref 8.4–10.5)
Chloride: 102 meq/L (ref 96–112)
Creatinine, Ser: 0.76 mg/dL (ref 0.40–1.20)
GFR: 97.2 mL/min (ref >60.00)
Glucose, Bld: 88 mg/dL (ref 70–99)
Potassium: 3.6 meq/L (ref 3.5–5.1)
Sodium: 136 meq/L (ref 135–145)

## 2023-11-06 MED ORDER — AMLODIPINE BESYLATE 10 MG PO TABS: 10.0000 mg | ORAL_TABLET | Freq: Every day | ORAL | 1 refills | Status: DC

## 2023-11-06 MED ORDER — LOSARTAN POTASSIUM 50 MG PO TABS: 50.0000 mg | ORAL_TABLET | Freq: Every day | ORAL | 1 refills | Status: DC

## 2023-11-06 MED ORDER — DROSPIRENONE-ETHINYL ESTRADIOL 3-0.02 MG PO TABS
1.0000 | ORAL_TABLET | Freq: Every day | ORAL | 11 refills | Status: AC
Start: 2023-11-06 — End: ?

## 2023-11-06 NOTE — Assessment & Plan Note (Signed)
 We talked about birth control options.  Currently not on contraception.  Has used oral contraception and Nexplanon in the past.  Would be interested in restarting oral contraception.  Does not want to have children right now.  I think birth control is a good idea especially because she is using losartan  for hypertension control.  She does not wish to have periods anymore so I have prescribed Yaz.

## 2023-11-06 NOTE — Progress Notes (Signed)
 New Patient Office Visit  Subjective    Patient ID: [name] [name], female    DOB: [birth year]  Age: 43 y.o. MRN: [id number]  CC:  Chief Complaint  Patient presents with   Establish Care    No concerns but does need BP medication refilled     HPI  [name] [name] presents to establish care  43 year old person here for management of hypertension.  Patient lives in [place], has lived there all her life.  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  Had difficult time finding employment since that time which has been stressful for her.  She lives with her partner and 2 children aged 33 and 26.  She reports a history of hypertension, first diagnosed at her last pregnancy.  Has intermittently been on hypertension medication past.  Couple years ago she had consultation with cardiology because of a sensation of chest pain.  Using these medications for about 2 years ever since then.  Only has a few tablets left reports good adherence with the medicine and no side effects.  Not currently using birth control, but does desire this.  No recent illnesses.  No fevers or chills.  No concerns about alcohol or tobacco use.  No other substance use.  Reports her mood is stable, denies depressed or anxious mood.  She is concerned about her weight, and questions about medication assisted treatment.    Outpatient Encounter Medications as of 11/06/2023  Medication Sig   drospirenone-ethinyl estradiol (YAZ) 3-0.02 MG tablet Take 1 tablet by mouth daily.   [DISCONTINUED] amLODipine  (NORVASC ) 10 MG tablet Take 1 tablet (10 mg total) by mouth daily.   [DISCONTINUED] losartan  (COZAAR ) 25 MG tablet TAKE 1 TABLET(25 MG) BY MOUTH DAILY   amLODipine  (NORVASC ) 10 MG tablet Take 1 tablet (10 mg total) by mouth daily.   losartan  (COZAAR ) 50 MG tablet Take 1 tablet (50 mg total) by mouth daily.   [DISCONTINUED] amphetamine-dextroamphetamine (ADDERALL) 30 MG tablet Take 1 tablet  by mouth 2 (two) times daily. (Patient not taking: Reported on 12/28/2021)   [DISCONTINUED] aspirin 81 MG chewable tablet Chew 81 mg by mouth in the morning and at bedtime. (Patient not taking: Reported on 12/28/2021)   [DISCONTINUED] Garlic (GARLIQUE PO) Take 1 tablet by mouth daily. (Patient not taking: Reported on 12/28/2021)   [DISCONTINUED] lidocaine  (XYLOCAINE ) 2 % solution Use as directed 15 mLs in the mouth or throat every 6 (six) hours as needed for mouth pain. Swish and spit   [DISCONTINUED] NON FORMULARY Super beets-2 tablets daily (Patient not taking: Reported on 12/28/2021)   [DISCONTINUED] trimethoprim -polymyxin b  (POLYTRIM ) ophthalmic solution Place 1 drop into the left eye every 6 (six) hours.   No facility-administered encounter medications on file as of 11/06/2023.    Past Medical History:  Diagnosis Date   Complication of anesthesia    difficulty breathing after surgery    Hypertension     Past Surgical History:  Procedure Laterality Date   CHOLECYSTECTOMY     PILONIDAL CYST EXCISION      Family History  Problem Relation Age of Onset   Hypertension Father    Cancer Other    Diabetes Other     Social History   Socioeconomic History   Marital status: Single    Spouse name: Not on file   Number of children: Not on file   Years of education: Not on file   Highest education level: Some college,  no degree  Occupational History   Not on file  Tobacco Use   Smoking status: Former   Smokeless tobacco: Never  Vaping Use   Vaping status: Never Used  Substance and Sexual Activity   Alcohol use: No   Drug use: No   Sexual activity: Yes    Birth control/protection: None  Other Topics Concern   Not on file  Social History Narrative   Not on file   Social Drivers of Health   Financial Resource Strain: Low Risk  (11/05/2023)   Overall Financial Resource Strain (CARDIA)    Difficulty of Paying Living Expenses: Not very hard  Food Insecurity: No Food Insecurity  (11/05/2023)   Hunger Vital Sign    Worried About Running Out of Food in the Last Year: Never true    Ran Out of Food in the Last Year: Never true  Transportation Needs: No Transportation Needs (11/05/2023)   PRAPARE - Administrator, Civil Service (Medical): No    Lack of Transportation (Non-Medical): No  Physical Activity: Insufficiently Active (11/05/2023)   Exercise Vital Sign    Days of Exercise per Week: 1 day    Minutes of Exercise per Session: 20 min  Stress: Stress Concern Present (11/05/2023)   [employer] of Occupational Health - Occupational Stress Questionnaire    Feeling of Stress : To some extent  Social Connections: Unknown (11/05/2023)   Social Connection and Isolation Panel [NHANES]    Frequency of Communication with Friends and Family: Three times a week    Frequency of Social Gatherings with Friends and Family: Once a week    Attends Religious Services: Patient declined    Database administrator or Organizations: No    Attends Engineer, structural: Not on file    Marital Status: Never married  Catering manager Violence: Not on file        Objective    BP (!) 163/101   Pulse (!) 101   Ht 5\' 7"  (1.702 m)   Wt 267 lb (121.1 kg)   SpO2 99%   BMI 41.82 kg/m   Physical Exam  Gen: Well-appearing Eyes: Normal Ears: Normal left tympanic membrane, right tympanic membrane had a scar from a prior perforation which is now healed Neck: Increased circumference, mildly enlarged thyroid, no nodules Heart: Regular, 2 out of 6 early systolic murmur best heard at the right upper sternal border Lungs: Unlabored, clear throughout Ext: Warm, no edema, normal joints Neuro: Alert, conversational, full strength upper and lower extremities Psych: Appropriate mood and affect, not anxious or depressed appearing.      Assessment & Plan:   Problem List Items Addressed This Visit       High   Hypertension - Primary (Chronic)   Chronic issue of  hypertension.  Likely multifactorial with obesity and genetic components.  Blood pressure currently not well-controlled on current doses of losartan  and amlodipine .  Will increase losartan  to 50 mg daily and continue amlodipine  10 mg daily.  Check BMP today.  Follow-up in 4 weeks.      Relevant Medications   amLODipine  (NORVASC ) 10 MG tablet   losartan  (COZAAR ) 50 MG tablet   Other Relevant Orders   Basic metabolic panel with GFR   Severe obesity (BMI >= 40) (HCC) (Chronic)   Chronic severe obesity.  Weight today 267 pounds with a BMI of 41.  Is a complication of obesity she has hypertension, at risk for other parts of metabolic syndrome as well.  We talked about nutrition.  We talked about medication assisted treatment.  She may be interested in this in the future.        Low   Birth control counseling (Chronic)   We talked about birth control options.  Currently not on contraception.  Has used oral contraception and Nexplanon in the past.  Would be interested in restarting oral contraception.  Does not want to have children right now.  I think birth control is a good idea especially because she is using losartan  for hypertension control.  She does not wish to have periods anymore so I have prescribed Yaz.      Relevant Medications   drospirenone-ethinyl estradiol (YAZ) 3-0.02 MG tablet    Return in about 4 weeks (around 12/04/2023) for blood pressure management.   Ether Hercules, MD

## 2023-11-06 NOTE — Assessment & Plan Note (Signed)
 Chronic issue of hypertension.  Likely multifactorial with obesity and genetic components.  Blood pressure currently not well-controlled on current doses of losartan  and amlodipine .  Will increase losartan  to 50 mg daily and continue amlodipine  10 mg daily.  Check BMP today.  Follow-up in 4 weeks.

## 2023-11-06 NOTE — Assessment & Plan Note (Signed)
 Chronic severe obesity.  Weight today 267 pounds with a BMI of 41.  Is a complication of obesity she has hypertension, at risk for other parts of metabolic syndrome as well.  We talked about nutrition.  We talked about medication assisted treatment.  She may be interested in this in the future.

## 2023-11-07 ENCOUNTER — Ambulatory Visit: Payer: Self-pay | Admitting: Student in an Organized Health Care Education/Training Program

## 2023-12-06 ENCOUNTER — Encounter: Payer: Self-pay | Admitting: Student in an Organized Health Care Education/Training Program

## 2023-12-06 ENCOUNTER — Ambulatory Visit (INDEPENDENT_AMBULATORY_CARE_PROVIDER_SITE_OTHER): Payer: Self-pay | Admitting: Student in an Organized Health Care Education/Training Program

## 2023-12-06 VITALS — BP 142/102 | HR 91 | Wt 267.0 lb

## 2023-12-06 DIAGNOSIS — I10 Essential (primary) hypertension: Secondary | ICD-10-CM

## 2023-12-06 DIAGNOSIS — Z3009 Encounter for other general counseling and advice on contraception: Secondary | ICD-10-CM

## 2023-12-06 NOTE — Assessment & Plan Note (Signed)
 She is interested in weight loss but cannot afford medications like Zepbound due to lack of insurance. Seeking employment for potential insurance coverage. - Encourage continued efforts in diet and exercise for weight management. - Discuss potential weight loss medications if insurance coverage becomes available.

## 2023-12-06 NOTE — Patient Instructions (Signed)
  VISIT SUMMARY: Today, we reviewed your blood pressure and discussed your current medications and overall health. Your blood pressure has improved but remains slightly elevated. We also talked about your weight management goals and your current birth control method.  YOUR PLAN: -HYPERTENSION: Hypertension means high blood pressure. Your blood pressure has improved but is still a bit high. Continue taking losartan  50 mg and amlodipine  10 mg daily. We also recommend making dietary changes and increasing physical activity. We will re-evaluate your blood pressure in 6 months.  -SEVERE OBESITY: Severe obesity means having a very high body weight. You are interested in losing weight but cannot afford certain medications due to lack of insurance. Continue with your diet and exercise efforts. We can discuss weight loss medications if you get insurance coverage.  -BIRTH CONTROL COUNSELING: You are using Yaz for birth control, which is working well for you without any side effects. Continue taking Yaz as prescribed and monitor for any changes in your menstrual cycle or any side effects.  INSTRUCTIONS: Please follow up in 6 months for a blood pressure re-evaluation. Continue with your current medications and lifestyle changes. If you get insurance coverage, we can discuss additional options for weight loss medications.

## 2023-12-06 NOTE — Assessment & Plan Note (Signed)
 Blood pressure improved but remains elevated at 142/102 mmHg. Current regimen includes losartan  50 mg and amlodipine  10 mg daily.  I think this control is adequate for now given the patient's young age, and low risk.  We are also working on lifestyle modifications including diet and exercise.  I think with weight loss her blood pressure would improve even more.  We are also avoiding lots of medication dose changes because she wants to limit lab draws.  We discussed low risk of hyperkalemia with losartan  increase and potential need for potassium monitoring.  I recommended a BMP today, patient declines due to cost concerns. - Continue losartan  50 mg daily. - Continue amlodipine  10 mg daily. - Encourage dietary modifications and increased physical activity. - Follow up in 6 months for blood pressure re-evaluation.

## 2023-12-06 NOTE — Assessment & Plan Note (Signed)
 She is on Yaz, affordable and well-tolerated, with no side effects reported. Experiencing expected menstrual period as it is the first cycle on Yaz. - Continue Yaz as prescribed. - Monitor for any side effects or changes in menstrual cycle.

## 2023-12-06 NOTE — Progress Notes (Signed)
 Established Patient Office Visit  Subjective   Patient ID: [name] [name], female    DOB: February 05, 1981  Age: 43 y.o. MRN: [id number]  Chief Complaint  Patient presents with   Medical Management of Chronic Issues    4 week follow up     HPI  Discussed the use of AI scribe software for clinical note transcription with the patient, who gave verbal consent to proceed.  History of Present Illness [name] [name] is a 43 year old female with hypertension who presents for a blood pressure recheck.  She has been on an increased dose of losartan , now 50 mg daily, for the past month with no issues. No lightheadedness upon standing. She adheres to her medication regimen, taking it first thing in the morning without missing any doses. The medication is affordable with a discount and she reports no side effects.  She is also taking Yaz for birth control, which she finds affordable and reports no side effects. She is currently on her menstrual period, which is expected as it is her first cycle on this medication. No headaches or other issues related to the medication are reported.  She is not currently working but is actively seeking employment. She does not smoke and is not covered by health insurance, which affects her ability to afford certain medications for weight loss.      Objective:     BP (!) 142/102   Pulse 91   Wt 267 lb (121.1 kg)   SpO2 100%   BMI 41.82 kg/m    Physical Exam  Physical Exam Gen: Well-appearing CARDIOVASCULAR: 2 out of 6 Early systolic murmur best heard at the left upper sternal border Lungs: Unlabored, clear throughout, no crackles EXTREMITIES: No edema in lower extremities.  Normal joints     Assessment & Plan:    Problem List Items Addressed This Visit       High   Hypertension - Primary (Chronic)   Blood pressure improved but remains elevated at 142/102 mmHg. Current regimen includes losartan  50 mg and amlodipine  10 mg daily.  I think this  control is adequate for now given the patient's young age, and low risk.  We are also working on lifestyle modifications including diet and exercise.  I think with weight loss her blood pressure would improve even more.  We are also avoiding lots of medication dose changes because she wants to limit lab draws.  We discussed low risk of hyperkalemia with losartan  increase and potential need for potassium monitoring.  I recommended a BMP today, patient declines due to cost concerns. - Continue losartan  50 mg daily. - Continue amlodipine  10 mg daily. - Encourage dietary modifications and increased physical activity. - Follow up in 6 months for blood pressure re-evaluation.      Severe obesity (BMI >= 40) (HCC) (Chronic)   She is interested in weight loss but cannot afford medications like Zepbound due to lack of insurance. Seeking employment for potential insurance coverage. - Encourage continued efforts in diet and exercise for weight management. - Discuss potential weight loss medications if insurance coverage becomes available.        Low   Birth control counseling (Chronic)   She is on Yaz, affordable and well-tolerated, with no side effects reported. Experiencing expected menstrual period as it is the first cycle on Yaz. - Continue Yaz as prescribed. - Monitor for any side effects or changes in menstrual cycle.       Return in about 6  months (around 06/07/2024).    Cleatus Debby Specking, MD

## 2024-06-03 ENCOUNTER — Other Ambulatory Visit: Payer: Self-pay | Admitting: Student in an Organized Health Care Education/Training Program

## 2024-06-03 DIAGNOSIS — I10 Essential (primary) hypertension: Secondary | ICD-10-CM

## 2024-06-07 ENCOUNTER — Ambulatory Visit: Payer: Self-pay | Admitting: Student in an Organized Health Care Education/Training Program
